# Patient Record
Sex: Female | Born: 1975 | Race: Black or African American | Hispanic: No | Marital: Single | Smoking: Never smoker
Health system: Southern US, Community
[De-identification: ages and names within clinical notes are randomized; demographics above are authoritative.]

## PROBLEM LIST (undated history)

## (undated) DIAGNOSIS — D219 Benign neoplasm of connective and other soft tissue, unspecified: Secondary | ICD-10-CM

## (undated) DIAGNOSIS — I10 Essential (primary) hypertension: Secondary | ICD-10-CM

## (undated) DIAGNOSIS — R600 Localized edema: Secondary | ICD-10-CM

## (undated) DIAGNOSIS — I160 Hypertensive urgency: Secondary | ICD-10-CM

## (undated) HISTORY — DX: Hypertensive urgency: I16.0

## (undated) HISTORY — DX: Benign neoplasm of connective and other soft tissue, unspecified: D21.9

## (undated) HISTORY — DX: Localized edema: R60.0

---

## 1998-11-20 ENCOUNTER — Emergency Department (HOSPITAL_COMMUNITY): Admission: EM | Admit: 1998-11-20 | Discharge: 1998-11-20 | Payer: Self-pay | Admitting: Emergency Medicine

## 1999-12-04 ENCOUNTER — Emergency Department (HOSPITAL_COMMUNITY): Admission: EM | Admit: 1999-12-04 | Discharge: 1999-12-04 | Payer: Self-pay

## 2001-02-02 ENCOUNTER — Inpatient Hospital Stay (HOSPITAL_COMMUNITY): Admission: AD | Admit: 2001-02-02 | Discharge: 2001-02-02 | Payer: Self-pay | Admitting: Obstetrics & Gynecology

## 2001-04-20 ENCOUNTER — Other Ambulatory Visit: Admission: RE | Admit: 2001-04-20 | Discharge: 2001-04-20 | Payer: Self-pay | Admitting: Obstetrics & Gynecology

## 2001-09-13 ENCOUNTER — Other Ambulatory Visit: Admission: RE | Admit: 2001-09-13 | Discharge: 2001-09-13 | Payer: Self-pay | Admitting: Obstetrics and Gynecology

## 2001-09-13 ENCOUNTER — Encounter: Payer: Self-pay | Admitting: Obstetrics and Gynecology

## 2001-09-13 ENCOUNTER — Inpatient Hospital Stay (HOSPITAL_COMMUNITY): Admission: AD | Admit: 2001-09-13 | Discharge: 2001-09-22 | Payer: Self-pay | Admitting: Obstetrics and Gynecology

## 2001-09-14 ENCOUNTER — Encounter: Payer: Self-pay | Admitting: Anesthesiology

## 2001-09-16 ENCOUNTER — Encounter: Payer: Self-pay | Admitting: Critical Care Medicine

## 2001-09-17 ENCOUNTER — Encounter: Payer: Self-pay | Admitting: Critical Care Medicine

## 2001-09-18 ENCOUNTER — Encounter: Payer: Self-pay | Admitting: Critical Care Medicine

## 2001-09-19 ENCOUNTER — Encounter: Payer: Self-pay | Admitting: Pulmonary Disease

## 2001-09-21 ENCOUNTER — Encounter: Payer: Self-pay | Admitting: Obstetrics and Gynecology

## 2001-11-26 ENCOUNTER — Inpatient Hospital Stay (HOSPITAL_COMMUNITY): Admission: AD | Admit: 2001-11-26 | Discharge: 2001-11-29 | Payer: Self-pay | Admitting: Obstetrics & Gynecology

## 2002-11-13 ENCOUNTER — Emergency Department (HOSPITAL_COMMUNITY): Admission: EM | Admit: 2002-11-13 | Discharge: 2002-11-13 | Payer: Self-pay | Admitting: Emergency Medicine

## 2002-11-17 ENCOUNTER — Emergency Department (HOSPITAL_COMMUNITY): Admission: EM | Admit: 2002-11-17 | Discharge: 2002-11-17 | Payer: Self-pay | Admitting: Emergency Medicine

## 2004-04-21 ENCOUNTER — Emergency Department (HOSPITAL_COMMUNITY): Admission: EM | Admit: 2004-04-21 | Discharge: 2004-04-21 | Payer: Self-pay | Admitting: Emergency Medicine

## 2004-04-26 ENCOUNTER — Other Ambulatory Visit: Admission: RE | Admit: 2004-04-26 | Discharge: 2004-04-26 | Payer: Self-pay | Admitting: Obstetrics & Gynecology

## 2004-12-08 ENCOUNTER — Inpatient Hospital Stay (HOSPITAL_COMMUNITY): Admission: RE | Admit: 2004-12-08 | Discharge: 2004-12-10 | Payer: Self-pay | Admitting: Obstetrics & Gynecology

## 2006-06-30 ENCOUNTER — Emergency Department (HOSPITAL_COMMUNITY): Admission: EM | Admit: 2006-06-30 | Discharge: 2006-06-30 | Payer: Self-pay | Admitting: Family Medicine

## 2006-12-23 ENCOUNTER — Emergency Department (HOSPITAL_COMMUNITY): Admission: EM | Admit: 2006-12-23 | Discharge: 2006-12-23 | Payer: Self-pay | Admitting: Family Medicine

## 2012-07-05 ENCOUNTER — Emergency Department (HOSPITAL_COMMUNITY)
Admission: EM | Admit: 2012-07-05 | Discharge: 2012-07-05 | Disposition: A | Discharge status: Home / Self Care | Payer: Self-pay | Attending: Emergency Medicine | Admitting: Emergency Medicine

## 2012-07-05 ENCOUNTER — Encounter (HOSPITAL_COMMUNITY): Payer: Self-pay

## 2012-07-05 DIAGNOSIS — K0381 Cracked tooth: Secondary | ICD-10-CM | POA: Insufficient documentation

## 2012-07-05 DIAGNOSIS — K0889 Other specified disorders of teeth and supporting structures: Secondary | ICD-10-CM

## 2012-07-05 MED ORDER — NAPROXEN 500 MG PO TABS: 500.0000 mg | ORAL_TABLET | Freq: Two times a day (BID) | ORAL | Status: AC | PRN

## 2012-07-05 MED ORDER — OXYCODONE-ACETAMINOPHEN 5-325 MG PO TABS: 1.0000 | ORAL_TABLET | ORAL | Status: AC | PRN

## 2012-07-05 MED ORDER — PENICILLIN V POTASSIUM 500 MG PO TABS: 500.0000 mg | ORAL_TABLET | Freq: Four times a day (QID) | ORAL | Status: AC

## 2012-07-05 NOTE — ED Notes (Signed)
Pt complains of dental pain, onset this week, sts rear right teeth swelling noted, no help with oragel, some releif with ice compress

## 2012-07-14 NOTE — ED Provider Notes (Signed)
History    51 six-year-old female with toothache. Right upper molar. Onset earlier this week. Denies acute trauma. Constant and worse when chewing and with drinking cold liquids. No difficulty breathing or swallowing. No facial swelling. No fevers or chills. Denies history of diabetes. Has not had dental evaluation for the same complaint.  CSN: [id number]  Arrival date & time 07/05/12  1027   First MD Initiated Contact with Patient 07/05/12 0818      Chief Complaint  Patient presents with  . Dental Pain    (Consider location/radiation/quality/duration/timing/severity/associated sxs/prior treatment) HPI  History reviewed. No pertinent past medical history.  No past surgical history on file.  No family history on file.  History  Substance Use Topics  . Smoking status: Not on file  . Smokeless tobacco: Not on file  . Alcohol Use: Not on file    OB History    Grav Para Term Preterm Abortions TAB SAB Ect Mult Living                  Review of Systems   Review of symptoms negative unless otherwise noted in HPI.   Allergies  Review of patient's allergies indicates no known allergies.  Home Medications   Current Outpatient Rx  Name Route Sig Dispense Refill  . IBUPROFEN 200 MG PO TABS Oral Take 400-800 mg by mouth every 6 (six) hours as needed. For pain    . NAPROXEN 500 MG PO TABS Oral Take 1 tablet (500 mg total) by mouth 2 (two) times daily as needed. 12 tablet 0  . OXYCODONE-ACETAMINOPHEN 5-325 MG PO TABS Oral Take 1 tablet by mouth every 4 (four) hours as needed for pain. 12 tablet 0    BP 164/102  Pulse 91  Temp 99 F (37.2 C) (Oral)  Resp 20  SpO2 100%  Physical Exam  Nursing note and vitals reviewed. Constitutional: She appears well-developed and well-nourished. No distress.  HENT:  Head: Normocephalic and atraumatic.       Cracked molar. No drainable collection. Handling secretions. Uvula midline. No tongue elevation. Submental tissues are soft. No  stridor. No cervical adenopathy. Neck is supple. Normal phonation.  Eyes: Conjunctivae are normal. Right eye exhibits no discharge. Left eye exhibits no discharge.  Neck: Neck supple.  Cardiovascular: Normal rate, regular rhythm and normal heart sounds.  Exam reveals no gallop and no friction rub.   No murmur heard. Pulmonary/Chest: Effort normal and breath sounds normal. No respiratory distress.  Abdominal: Soft. She exhibits no distension. There is no tenderness.  Musculoskeletal: She exhibits no edema and no tenderness.  Neurological: She is alert.  Skin: Skin is warm and dry.  Psychiatric: She has a normal mood and affect. Her behavior is normal. Thought content normal.    ED Course  Procedures (including critical care time)  Labs Reviewed - No data to display No results found.   1. Pain, dental       MDM  36yf with dental pain. No evidence of deep space neck infection or airway compromise. Plan course abx, prn pain meds and dental fu.        Raeford Razor, MD 07/14/12 [phone]

## 2016-02-17 ENCOUNTER — Encounter (HOSPITAL_COMMUNITY): Payer: Self-pay | Admitting: *Deleted

## 2016-02-17 ENCOUNTER — Emergency Department (HOSPITAL_COMMUNITY): Payer: Self-pay

## 2016-02-17 ENCOUNTER — Emergency Department (HOSPITAL_COMMUNITY)
Admission: EM | Admit: 2016-02-17 | Discharge: 2016-02-17 | Disposition: A | Discharge status: Home / Self Care | Payer: Self-pay | Attending: Emergency Medicine | Admitting: Emergency Medicine

## 2016-02-17 DIAGNOSIS — J069 Acute upper respiratory infection, unspecified: Secondary | ICD-10-CM

## 2016-02-17 MED ORDER — GUAIFENESIN ER 600 MG PO TB12: 1200.0000 mg | ORAL_TABLET | Freq: Two times a day (BID) | ORAL | Status: DC | PRN

## 2016-02-17 MED ORDER — IBUPROFEN 400 MG PO TABS
800.0000 mg | ORAL_TABLET | Freq: Once | ORAL | Status: AC
  Administered 2016-02-17: 800 mg via ORAL
  Filled 2016-02-17: qty 2

## 2016-02-17 MED ORDER — ACETAMINOPHEN 325 MG PO TABS
650.0000 mg | ORAL_TABLET | Freq: Once | ORAL | Status: AC
  Administered 2016-02-17: 650 mg via ORAL
  Filled 2016-02-17: qty 2

## 2016-02-17 MED ORDER — IBUPROFEN 800 MG PO TABS: 800.0000 mg | ORAL_TABLET | Freq: Three times a day (TID) | ORAL | Status: DC

## 2016-02-17 MED ORDER — BENZONATATE 100 MG PO CAPS: 100.0000 mg | ORAL_CAPSULE | Freq: Three times a day (TID) | ORAL | Status: DC

## 2016-02-17 NOTE — ED Notes (Signed)
Declined W/C at D/C and was escorted to lobby by RN. 

## 2016-02-17 NOTE — ED Notes (Signed)
PT reports URI since SAT.

## 2016-02-17 NOTE — Discharge Instructions (Signed)
Upper Respiratory Infection, Adult Most upper respiratory infections (URIs) are a viral infection of the air passages leading to the lungs. A URI affects the nose, throat, and upper air passages. The most common type of URI is nasopharyngitis and is typically referred to as "the common cold." URIs run their course and usually go away on their own. Most of the time, a URI does not require medical attention, but sometimes a bacterial infection in the upper airways can follow a viral infection. This is called a secondary infection. Sinus and middle ear infections are common types of secondary upper respiratory infections. Bacterial pneumonia can also complicate a URI. A URI can worsen asthma and chronic obstructive pulmonary disease (COPD). Sometimes, these complications can require emergency medical care and may be life threatening.  CAUSES Almost all URIs are caused by viruses. A virus is a type of germ and can spread from one person to another.  RISKS FACTORS You may be at risk for a URI if:   You smoke.   You have chronic heart or lung disease.  You have a weakened defense (immune) system.   You are very young or very old.   You have nasal allergies or asthma.  You work in crowded or poorly ventilated areas.  You work in health care facilities or schools. SIGNS AND SYMPTOMS  Symptoms typically develop 2-3 days after you come in contact with a cold virus. Most viral URIs last 7-10 days. However, viral URIs from the influenza virus (flu virus) can last 14-18 days and are typically more severe. Symptoms may include:   Runny or stuffy (congested) nose.   Sneezing.   Cough.   Sore throat.   Headache.   Fatigue.   Fever.   Loss of appetite.   Pain in your forehead, behind your eyes, and over your cheekbones (sinus pain).  Muscle aches.  DIAGNOSIS  Your health care provider may diagnose a URI by:  Physical exam.  Tests to check that your symptoms are not due to  another condition such as:  Strep throat.  Sinusitis.  Pneumonia.  Asthma. TREATMENT  A URI goes away on its own with time. It cannot be cured with medicines, but medicines may be prescribed or recommended to relieve symptoms. Medicines may help:  Reduce your fever.  Reduce your cough.  Relieve nasal congestion. HOME CARE INSTRUCTIONS   Take medicines only as directed by your health care provider.   Gargle warm saltwater or take cough drops to comfort your throat as directed by your health care provider.  Use a warm mist humidifier or inhale steam from a shower to increase air moisture. This may make it easier to breathe.  Drink enough fluid to keep your urine clear or pale yellow.   Eat soups and other clear broths and maintain good nutrition.   Rest as needed.   Return to work when your temperature has returned to normal or as your health care provider advises. You may need to stay home longer to avoid infecting others. You can also use a face mask and careful hand washing to prevent spread of the virus.  Increase the usage of your inhaler if you have asthma.   Do not use any tobacco products, including cigarettes, chewing tobacco, or electronic cigarettes. If you need help quitting, ask your health care provider. PREVENTION  The best way to protect yourself from getting a cold is to practice good hygiene.   Avoid oral or hand contact with people with cold   symptoms.   Wash your hands often if contact occurs.  There is no clear evidence that vitamin C, vitamin E, echinacea, or exercise reduces the chance of developing a cold. However, it is always recommended to get plenty of rest, exercise, and practice good nutrition.  SEEK MEDICAL CARE IF:   You are getting worse rather than better.   Your symptoms are not controlled by medicine.   You have chills.  You have worsening shortness of breath.  You have brown or red mucus.  You have yellow or brown nasal  discharge.  You have pain in your face, especially when you bend forward.  You have a fever.  You have swollen neck glands.  You have pain while swallowing.  You have white areas in the back of your throat. SEEK IMMEDIATE MEDICAL CARE IF:   You have severe or persistent:  Headache.  Ear pain.  Sinus pain.  Chest pain.  You have chronic lung disease and any of the following:  Wheezing.  Prolonged cough.  Coughing up blood.  A change in your usual mucus.  You have a stiff neck.  You have changes in your:  Vision.  Hearing.  Thinking.  Mood. MAKE SURE YOU:   Understand these instructions.  Will watch your condition.  Will get help right away if you are not doing well or get worse.   This information is not intended to replace advice given to you by your health care provider. Make sure you discuss any questions you have with your health care provider.   Document Released: 04/19/2001 Document Revised: 03/10/2015 Document Reviewed: 01/29/2014 Elsevier Interactive Patient Education 2016 [employer]  

## 2016-02-17 NOTE — ED Provider Notes (Signed)
CSN: [id number]     Arrival date & time 02/17/16  1530 History  By signing my name below, I, Mary Rios, attest that this documentation has been prepared under the direction and in the presence of [employer], PA-C. Electronically Signed: Randa Rios, ED Scribe. 02/17/2016. 4:28 PM.      Chief Complaint  Patient presents with  . URI   The history is provided by the patient. No language interpreter was used.   HPI Comments: Mary Rios is a 40 y.o. female who presents to the Emergency Department complaining of URI symptoms onset 4 days prior. Pt reports subjective fever, chills, cough, sore throat, congestion. Pt also reports resolved myalgias. Pt states that she has tried theraflu with no relief. Pt states she initially thought here symptoms were due to seasonal allergies and tried taking zyrtec with no relief. Pt denies ear pain, SOB, CP, nausea, vomiting, diarrhea or abdominal pain. Pt does report being around sick co workers. Her symptoms are constant, moderate, and gradually improving.   History reviewed. No pertinent past medical history. History reviewed. No pertinent past surgical history. History reviewed. No pertinent family history. Social History  Substance Use Topics  . Smoking status: Never Smoker   . Smokeless tobacco: Never Used  . Alcohol Use: No   OB History    No data available     Review of Systems  Constitutional: Positive for fever and chills.  HENT: Positive for congestion and sore throat. Negative for ear pain.   Respiratory: Positive for cough. Negative for shortness of breath.   Cardiovascular: Negative for chest pain.  Gastrointestinal: Negative for nausea, vomiting, abdominal pain and diarrhea.  All other systems reviewed and are negative.     Allergies  Review of patient's allergies indicates no known allergies.  Home Medications   Prior to Admission medications   Medication Sig Start Date End Date Taking? Authorizing Provider   ibuprofen (ADVIL,MOTRIN) 200 MG tablet Take 400-800 mg by mouth every 6 (six) hours as needed. For pain    Historical Provider, MD   BP 178/94 mmHg  Pulse 97  Temp(Src) 99.2 F (37.3 C) (Oral)  Resp 20  SpO2 100%  LMP 02/16/2016   Physical Exam  Constitutional: She is oriented to person, place, and time. She appears well-developed and well-nourished.  Non-toxic appearance. She does not have a sickly appearance. She does not appear ill.  HENT:  Head: Normocephalic and atraumatic.  Right Ear: Tympanic membrane and external ear normal. Tympanic membrane is not erythematous and not bulging.  Left Ear: Tympanic membrane and external ear normal. Tympanic membrane is not erythematous and not bulging.  Nose: Nose normal.  Mouth/Throat: Uvula is midline, oropharynx is clear and moist and mucous membranes are normal. No oropharyngeal exudate, posterior oropharyngeal edema or posterior oropharyngeal erythema.  Eyes: Conjunctivae are normal. Pupils are equal, round, and reactive to light. No scleral icterus.  Neck: Normal range of motion. Neck supple. No tracheal deviation present.  No nuchal rigidity.  Cardiovascular: Normal rate, regular rhythm and normal heart sounds.   No murmur heard. Pulmonary/Chest: Effort normal and breath sounds normal. No accessory muscle usage or stridor. No respiratory distress. She has no wheezes. She has no rhonchi. She has no rales.  Abdominal: Soft. Bowel sounds are normal. She exhibits no distension. There is no tenderness.  Musculoskeletal: Normal range of motion.  Lymphadenopathy:    She has no cervical adenopathy.  Neurological: She is alert and oriented to person, place, and time.  Speech clear without dysarthria.  Skin: Skin is warm and dry.  Psychiatric: She has a normal mood and affect. Her behavior is normal.  Nursing note and vitals reviewed.   ED Course  Procedures (including critical care time) DIAGNOSTIC STUDIES: Oxygen Saturation is 100% on  RA, normal by my interpretation.    COORDINATION OF CARE: 4:26 PM-Discussed treatment plan with pt at bedside and pt agreed to plan.     Labs Review Labs Reviewed - No data to display  Imaging Review Dg Chest 2 View  02/17/2016  CLINICAL DATA:  Chest tightness for 4 days EXAM: CHEST  2 VIEW COMPARISON:  None. FINDINGS: The heart size and mediastinal contours are within normal limits. Both lungs are clear. The visualized skeletal structures are unremarkable. IMPRESSION: No active cardiopulmonary disease. Electronically Signed   By: Inez Catalina M.D.   On: 02/17/2016 16:52      EKG Interpretation None      MDM   Final diagnoses:  URI (upper respiratory infection)   Patient with likely viral URI. VSS, NAD.  Well appearing, non-toxic. On exam, HENT exam unremarkable.  Heart RRR, lungs CTAB, abdomen soft and benign.  CXR to evaluate for PNA.  CENTOR criteria-1, no indication for further testing, low likelihood strep.  Patient given ibuprofen and tylenol in ED.  No coughing observed in ED.  Plan to discharge home with tessalon perle.  Recommend tylenol, ibuprofen, and mucinex for symptomatic control.  Follow up PCP. Discussed return precautions.  Patient agrees and acknowledges the above plan for discharge.    I personally performed the services described in this documentation, which was scribed in my presence. The recorded information has been reviewed and is accurate.      Gloriann Loan, PA-C 02/17/16 1740  Merrily Pew, MD 02/20/16 [phone]

## 2019-12-19 ENCOUNTER — Other Ambulatory Visit: Payer: Self-pay

## 2019-12-19 ENCOUNTER — Ambulatory Visit (HOSPITAL_COMMUNITY)
Admission: EM | Admit: 2019-12-19 | Discharge: 2019-12-19 | Disposition: A | Discharge status: Home / Self Care | Payer: BC Managed Care – PPO | Attending: Family Medicine | Admitting: Family Medicine

## 2019-12-19 ENCOUNTER — Encounter (HOSPITAL_COMMUNITY): Payer: Self-pay | Admitting: Emergency Medicine

## 2019-12-19 DIAGNOSIS — N898 Other specified noninflammatory disorders of vagina: Secondary | ICD-10-CM | POA: Diagnosis present

## 2019-12-19 DIAGNOSIS — Z3202 Encounter for pregnancy test, result negative: Secondary | ICD-10-CM

## 2019-12-19 DIAGNOSIS — R109 Unspecified abdominal pain: Secondary | ICD-10-CM | POA: Insufficient documentation

## 2019-12-19 LAB — POCT URINALYSIS DIP (DEVICE)
Bilirubin Urine: NEGATIVE
Glucose, UA: NEGATIVE mg/dL
Ketones, ur: NEGATIVE mg/dL
Nitrite: NEGATIVE
Protein, ur: 30 mg/dL — AB
Specific Gravity, Urine: 1.025 (ref 1.005–1.030)
Urobilinogen, UA: 0.2 mg/dL (ref 0.0–1.0)
pH: 5.5 (ref 5.0–8.0)

## 2019-12-19 LAB — POC URINE PREG, ED: Preg Test, Ur: NEGATIVE

## 2019-12-19 LAB — POCT PREGNANCY, URINE: Preg Test, Ur: NEGATIVE

## 2019-12-19 MED ORDER — LIDOCAINE HCL (PF) 1 % IJ SOLN
INTRAMUSCULAR | Status: AC
  Filled 2019-12-19: qty 2

## 2019-12-19 MED ORDER — METRONIDAZOLE 500 MG PO TABS: 500.0000 mg | ORAL_TABLET | Freq: Two times a day (BID) | ORAL | 0 refills | Status: DC

## 2019-12-19 MED ORDER — CEFTRIAXONE SODIUM 500 MG IJ SOLR
INTRAMUSCULAR | Status: AC
  Filled 2019-12-19: qty 500

## 2019-12-19 MED ORDER — CEFTRIAXONE SODIUM 500 MG IJ SOLR
500.0000 mg | Freq: Once | INTRAMUSCULAR | Status: AC
  Administered 2019-12-19: 500 mg via INTRAMUSCULAR

## 2019-12-19 NOTE — Discharge Instructions (Addendum)
Treating you for STDs today. Antibiotic injection given here and medicine sent to the pharmacy.  Swab sent for testing and we will call with any positive results.

## 2019-12-19 NOTE — ED Triage Notes (Addendum)
Vaginal discharge noticed monday.  Discharge is greenish and has an odor.  Patient reports about to start period in one week and having crampy feeling she associates with starting period  Patient denies burning with urination, but is urinating more frequently

## 2019-12-20 LAB — URINE CULTURE: Culture: 1000 — AB

## 2019-12-20 NOTE — ED Provider Notes (Signed)
[place]    CSN: [id number] Arrival date & time: 12/19/19  1002      History   Chief Complaint Chief Complaint  Patient presents with  . Vaginal Discharge    HPI Mary Rios is a 44 y.o. female.   Patient is a 45 year old female presents today with vaginal discharge.  Describes the discharge as green and malodorous.  This is been constant.  She is currently sexual active with 1 partner, unprotected.  Some generalized intermittent abdominal cramping.  Denies any vaginal itching, irritation, dysuria, hematuria but is having some urinary frequency.  No pelvic pain, flank pain, nausea or vomiting.  No recorded fevers at home.  ROS per HPI      History reviewed. No pertinent past medical history.  There are no problems to display for this patient.   Past Surgical History:  Procedure Laterality Date  . CESAREAN SECTION      OB History   No obstetric history on file.      Home Medications    Prior to Admission medications   Medication Sig Start Date End Date Taking? Authorizing Provider  ibuprofen (ADVIL,MOTRIN) 800 MG tablet Take 1 tablet (800 mg total) by mouth 3 (three) times daily. 02/17/16   Gloriann Loan, PA-C  metroNIDAZOLE (FLAGYL) 500 MG tablet Take 1 tablet (500 mg total) by mouth 2 (two) times daily. 12/19/19   Orvan July, NP    Family History Family History  Problem Relation Age of Onset  . Hypertension Mother   . Diabetes Mother     Social History Social History   Tobacco Use  . Smoking status: Never Smoker  . Smokeless tobacco: Never Used  Substance Use Topics  . Alcohol use: Yes  . Drug use: No     Allergies   Patient has no known allergies.   Review of Systems Review of Systems   Physical Exam Triage Vital Signs ED Triage Vitals  Enc Vitals Group     BP 12/19/19 1020 (!) 160/88     Pulse Rate 12/19/19 1020 (!) 103     Resp 12/19/19 1020 18     Temp 12/19/19 1020 99 F (37.2 C)     Temp Source 12/19/19  1020 Oral     SpO2 12/19/19 1020 100 %     Weight --      Height --      Head Circumference --      Peak Flow --      Pain Score 12/19/19 1017 1     Pain Loc --      Pain Edu? --      Excl. in [place]? --    No data found.  Updated Vital Signs BP (!) 160/88 (BP Location: Left Arm)   Pulse (!) 103   Temp 99 F (37.2 C) (Oral)   Resp 18   LMP 11/30/2019   SpO2 100%   Visual Acuity Right Eye Distance:   Left Eye Distance:   Bilateral Distance:    Right Eye Near:   Left Eye Near:    Bilateral Near:     Physical Exam Vitals and nursing note reviewed.  Constitutional:      General: She is not in acute distress.    Appearance: Normal appearance. She is not ill-appearing, toxic-appearing or diaphoretic.  HENT:     Head: Normocephalic.     Nose: Nose normal.  Eyes:     Conjunctiva/sclera: Conjunctivae normal.  Pulmonary:  Effort: Pulmonary effort is normal.  Abdominal:     Palpations: Abdomen is soft.     Tenderness: There is no abdominal tenderness.  Genitourinary:    Comments: Deferred  Musculoskeletal:        General: Normal range of motion.     Cervical back: Normal range of motion.  Skin:    General: Skin is warm and dry.     Findings: No rash.  Neurological:     Mental Status: She is alert.  Psychiatric:        Mood and Affect: Mood normal.      UC Treatments / Results  Labs (all labs ordered are listed, but only abnormal results are displayed) Labs Reviewed  URINE CULTURE - Abnormal; Notable for the following components:      Result Value   Culture   (*)    Value: 1,000 COLONIES/mL GROUP B STREP(S.AGALACTIAE)ISOLATED TESTING AGAINST S. AGALACTIAE NOT ROUTINELY PERFORMED DUE TO PREDICTABILITY OF AMP/PEN/VAN SUSCEPTIBILITY. Performed at [facility], [place] [address], [place], [place] [address]    All other components within normal limits  POCT URINALYSIS DIP (DEVICE) - Abnormal; Notable for the following components:   Hgb urine dipstick TRACE  (*)    Protein, ur 30 (*)    Leukocytes,Ua SMALL (*)    All other components within normal limits  POCT PREGNANCY, URINE  POC URINE PREG, ED  CERVICOVAGINAL ANCILLARY ONLY    EKG   Radiology No results found.  Procedures Procedures (including critical care time)  Medications Ordered in UC Medications  cefTRIAXone (ROCEPHIN) injection 500 mg (500 mg Intramuscular Given 12/19/19 1057)    Initial Impression / Assessment and Plan / UC Course  I have reviewed the triage vital signs and the nursing notes.  Pertinent labs & imaging results that were available during my care of the patient were reviewed by me and considered in my medical decision making (see chart for details).     Vaginal discharge-patient reported discharge to be green and malodorous.  Most likely diagnosis would be trichomonas.  We will go ahead and cover for that today with Flagyl twice a day for 7 days.  This will  also treat for possible BV. Giving Rocephin injection here for possible gonorrhea Swab sent for testing labs pending. Final Clinical Impressions(s) / UC Diagnoses   Final diagnoses:  Vaginal discharge     Discharge Instructions     Treating you for STDs today. Antibiotic injection given here and medicine sent to the pharmacy.  Swab sent for testing and we will call with any positive results.     ED Prescriptions    Medication Sig Dispense Auth. Provider   metroNIDAZOLE (FLAGYL) 500 MG tablet Take 1 tablet (500 mg total) by mouth 2 (two) times daily. 14 tablet Kavonte Bearse A, NP     PDMP not reviewed this encounter.   Orvan July, NP 12/20/19 1432

## 2019-12-21 LAB — CERVICOVAGINAL ANCILLARY ONLY
Bacterial vaginitis: POSITIVE — AB
Candida vaginitis: NEGATIVE
Chlamydia: NEGATIVE
Neisseria Gonorrhea: NEGATIVE
Trichomonas: POSITIVE — AB

## 2019-12-23 ENCOUNTER — Telehealth (HOSPITAL_COMMUNITY): Payer: Self-pay | Admitting: Emergency Medicine

## 2019-12-23 NOTE — Telephone Encounter (Signed)
Bacterial Vaginosis test is positive.  Prescription for metronidazole was given at the urgent care visit.  Trichomonas is positive. Rx metronidazole was given at the urgent care visit. Please refrain from sexual intercourse for 7 days to give the medicine time to work. Sexual partners need to be notified and tested/treated. Condoms may reduce risk of reinfection. Recheck for further evaluation if symptoms are not improving.   Patient contacted by phone and made aware of    results. Pt verbalized understanding and had all questions answered.

## 2020-01-09 ENCOUNTER — Ambulatory Visit (HOSPITAL_COMMUNITY)
Admission: EM | Admit: 2020-01-09 | Discharge: 2020-01-09 | Disposition: A | Discharge status: Home / Self Care | Payer: BC Managed Care – PPO | Attending: Family Medicine | Admitting: Family Medicine

## 2020-01-09 ENCOUNTER — Other Ambulatory Visit: Payer: Self-pay

## 2020-01-09 ENCOUNTER — Ambulatory Visit (INDEPENDENT_AMBULATORY_CARE_PROVIDER_SITE_OTHER): Payer: BC Managed Care – PPO

## 2020-01-09 ENCOUNTER — Encounter (HOSPITAL_COMMUNITY): Payer: Self-pay

## 2020-01-09 DIAGNOSIS — R1903 Right lower quadrant abdominal swelling, mass and lump: Secondary | ICD-10-CM

## 2020-01-09 DIAGNOSIS — N898 Other specified noninflammatory disorders of vagina: Secondary | ICD-10-CM

## 2020-01-09 LAB — POCT URINALYSIS DIP (DEVICE)
Bilirubin Urine: NEGATIVE
Glucose, UA: NEGATIVE mg/dL
Ketones, ur: NEGATIVE mg/dL
Nitrite: NEGATIVE
Protein, ur: NEGATIVE mg/dL
Specific Gravity, Urine: 1.015 (ref 1.005–1.030)
Urobilinogen, UA: 0.2 mg/dL (ref 0.0–1.0)
pH: 7 (ref 5.0–8.0)

## 2020-01-09 NOTE — ED Triage Notes (Signed)
Pt states she has a vaginal discharge and foul odor 6 days. Pt state she has some blood on the tissue after a bowel movement pt states the stools are soft x 4 days

## 2020-01-09 NOTE — ED Provider Notes (Addendum)
[place]   [id number] 01/09/20 Arrival Time: [id number]  ASSESSMENT & PLAN:  1. Right lower quadrant abdominal mass   2. Foul smelling vaginal discharge     Discussed concern over palpable abdominal mass along with vaginal discharge that smells of feces. Offered exam here out of concern over the possibility of rectovaginal fistula. She does not currently have a primary care provider and prefers ED evaluation at this time for a more definitive diagnosis as I am unable to perform advanced imaging here. Defers vaginal/rectal exam here.  UPT negative. Stable upon discharge. Reports she will proceed to the ED now.  I have personally viewed the imaging studies ordered this visit. Apparent mass RLQ; much stool. No signs of obstruction.  Reviewed expectations re: course of current medical issues. Questions answered. Outlined signs and symptoms indicating need for more acute intervention. Patient verbalized understanding. After Visit Summary given.   SUBJECTIVE: History from: patient. Mary Rios is a 44 y.o. female who reports mild lower abdominal discomfort along with sporadic "dark" malodorous vaginal discharge with fecal odor. Discharge noted approx 5-6 d ago. Occasional bright red blood noted after bowel movements. Questions abdominal discomfort started over the past couple of days. Bowel movements are soft. No straining with bowel movements. Normal PO intake without n/v. No urinary symptoms. Is sexually active with one female partner. Treated for BV and trich last month. Patient's last menstrual period was 01/01/2020. Reports menstrual periods are regular. Ambulatory without difficulty. No recent weight changes reported. No recent travel. No fevers reported. Symptoms do not wake her at night. No LE edema. No new medications.  Reports that she does not have a primary care provider.    Past Surgical History:  Procedure Laterality Date  . CESAREAN SECTION        OBJECTIVE:  Vitals:   01/09/20 1647 01/09/20 1649  BP:  140/88  Pulse:  100  Resp:  16  Temp:  100.1 F (37.8 C)  TempSrc:  Oral  SpO2:  100%  Weight: 61.2 kg    Low grade temp noted.   General appearance: alert, oriented, no acute distress HEENT: [place]; AT Lungs: unlabored respirations Abdomen: overall soft; lower abdomen appears protruded (R>L) with a firm palpable mass; does report TTP with exam; no guarding or rebound tenderness Back: without CVA tenderness; FROM at waist GU/Rectal: declined Extremities: without LE edema; symmetrical; without gross deformities Skin: warm and dry Neurologic: normal gait Psychological: alert and cooperative; normal mood and affect  Labs: Results for orders placed or performed during the hospital encounter of 01/09/20  POCT urinalysis dip (device)  Result Value Ref Range   Glucose, UA NEGATIVE NEGATIVE mg/dL   Bilirubin Urine NEGATIVE NEGATIVE   Ketones, ur NEGATIVE NEGATIVE mg/dL   Specific Gravity, Urine 1.015 1.005 - 1.030   Hgb urine dipstick TRACE (A) NEGATIVE   pH 7.0 5.0 - 8.0   Protein, ur NEGATIVE NEGATIVE mg/dL   Urobilinogen, UA 0.2 0.0 - 1.0 mg/dL   Nitrite NEGATIVE NEGATIVE   Leukocytes,Ua TRACE (A) NEGATIVE   Labs Reviewed  POC URINE PREG, ED  CERVICOVAGINAL ANCILLARY ONLY    No Known Allergies                                             History reviewed. No pertinent past medical history.  Social History   Socioeconomic History  .  Marital status: Single    Spouse name: Not on file  . Number of children: Not on file  . Years of education: Not on file  . Highest education level: Not on file  Occupational History  . Not on file  Tobacco Use  . Smoking status: Never Smoker  . Smokeless tobacco: Never Used  Substance and Sexual Activity  . Alcohol use: Yes  . Drug use: No  . Sexual activity: Not on file  Other Topics Concern  . Not on file  Social History Narrative  . Not on file   Social  Determinants of Health   Financial Resource Strain:   . Difficulty of Paying Living Expenses: Not on file  Food Insecurity:   . Worried About Charity fundraiser in the Last Year: Not on file  . Ran Out of Food in the Last Year: Not on file  Transportation Needs:   . Lack of Transportation (Medical): Not on file  . Lack of Transportation (Non-Medical): Not on file  Physical Activity:   . Days of Exercise per Week: Not on file  . Minutes of Exercise per Session: Not on file  Stress:   . Feeling of Stress : Not on file  Social Connections:   . Frequency of Communication with Friends and Family: Not on file  . Frequency of Social Gatherings with Friends and Family: Not on file  . Attends Religious Services: Not on file  . Active Member of Clubs or Organizations: Not on file  . Attends Archivist Meetings: Not on file  . Marital Status: Not on file  Intimate Partner Violence:   . Fear of Current or Ex-Partner: Not on file  . Emotionally Abused: Not on file  . Physically Abused: Not on file  . Sexually Abused: Not on file    Family History  Problem Relation Age of Onset  . Hypertension Mother   . Diabetes Mother      Vanessa Kick, MD 01/09/20 Gwendlyn Deutscher, MD 01/09/20 [phone]

## 2020-01-10 ENCOUNTER — Other Ambulatory Visit: Payer: Self-pay

## 2020-01-10 ENCOUNTER — Emergency Department (HOSPITAL_COMMUNITY): Payer: BC Managed Care – PPO

## 2020-01-10 ENCOUNTER — Encounter (HOSPITAL_COMMUNITY): Payer: Self-pay | Admitting: Emergency Medicine

## 2020-01-10 ENCOUNTER — Emergency Department (HOSPITAL_COMMUNITY)
Admission: EM | Admit: 2020-01-10 | Discharge: 2020-01-10 | Disposition: A | Discharge status: Home / Self Care | Payer: BC Managed Care – PPO | Attending: Emergency Medicine | Admitting: Emergency Medicine

## 2020-01-10 DIAGNOSIS — N899 Noninflammatory disorder of vagina, unspecified: Secondary | ICD-10-CM | POA: Diagnosis present

## 2020-01-10 DIAGNOSIS — T192XXA Foreign body in vulva and vagina, initial encounter: Secondary | ICD-10-CM | POA: Insufficient documentation

## 2020-01-10 DIAGNOSIS — Y999 Unspecified external cause status: Secondary | ICD-10-CM | POA: Diagnosis not present

## 2020-01-10 DIAGNOSIS — Y939 Activity, unspecified: Secondary | ICD-10-CM | POA: Insufficient documentation

## 2020-01-10 DIAGNOSIS — D259 Leiomyoma of uterus, unspecified: Secondary | ICD-10-CM | POA: Diagnosis not present

## 2020-01-10 DIAGNOSIS — R1903 Right lower quadrant abdominal swelling, mass and lump: Secondary | ICD-10-CM | POA: Diagnosis not present

## 2020-01-10 DIAGNOSIS — X58XXXA Exposure to other specified factors, initial encounter: Secondary | ICD-10-CM | POA: Insufficient documentation

## 2020-01-10 DIAGNOSIS — Y929 Unspecified place or not applicable: Secondary | ICD-10-CM | POA: Diagnosis not present

## 2020-01-10 DIAGNOSIS — N898 Other specified noninflammatory disorders of vagina: Secondary | ICD-10-CM

## 2020-01-10 DIAGNOSIS — K921 Melena: Secondary | ICD-10-CM | POA: Insufficient documentation

## 2020-01-10 LAB — LIPASE, BLOOD: Lipase: 23 U/L (ref 11–51)

## 2020-01-10 LAB — CBC WITH DIFFERENTIAL/PLATELET
Abs Immature Granulocytes: 0 10*3/uL (ref 0.00–0.07)
Basophils Absolute: 0 10*3/uL (ref 0.0–0.1)
Basophils Relative: 0 %
Eosinophils Absolute: 0.2 10*3/uL (ref 0.0–0.5)
Eosinophils Relative: 2 %
HCT: 26.4 % — ABNORMAL LOW (ref 36.0–46.0)
Hemoglobin: 7.6 g/dL — ABNORMAL LOW (ref 12.0–15.0)
Lymphocytes Relative: 27 %
Lymphs Abs: 2.1 10*3/uL (ref 0.7–4.0)
MCH: 16.3 pg — ABNORMAL LOW (ref 26.0–34.0)
MCHC: 28.8 g/dL — ABNORMAL LOW (ref 30.0–36.0)
MCV: 56.7 fL — ABNORMAL LOW (ref 80.0–100.0)
Monocytes Absolute: 0.8 10*3/uL (ref 0.1–1.0)
Monocytes Relative: 10 %
Neutro Abs: 4.8 10*3/uL (ref 1.7–7.7)
Neutrophils Relative %: 61 %
Platelets: ADEQUATE 10*3/uL (ref 150–400)
RBC: 4.66 MIL/uL (ref 3.87–5.11)
RDW: 20.5 % — ABNORMAL HIGH (ref 11.5–15.5)
WBC: 7.9 10*3/uL (ref 4.0–10.5)
nRBC: 0 % (ref 0.0–0.2)

## 2020-01-10 LAB — URINALYSIS, ROUTINE W REFLEX MICROSCOPIC
Bacteria, UA: NONE SEEN
Bilirubin Urine: NEGATIVE
Glucose, UA: NEGATIVE mg/dL
Hgb urine dipstick: NEGATIVE
Ketones, ur: NEGATIVE mg/dL
Nitrite: NEGATIVE
Protein, ur: NEGATIVE mg/dL
Specific Gravity, Urine: 1.016 (ref 1.005–1.030)
pH: 6 (ref 5.0–8.0)

## 2020-01-10 LAB — WET PREP, GENITAL
Clue Cells Wet Prep HPF POC: NONE SEEN
Sperm: NONE SEEN
Trich, Wet Prep: NONE SEEN
Yeast Wet Prep HPF POC: NONE SEEN

## 2020-01-10 LAB — COMPREHENSIVE METABOLIC PANEL
ALT: 13 U/L (ref 0–44)
AST: 20 U/L (ref 15–41)
Albumin: 3.5 g/dL (ref 3.5–5.0)
Alkaline Phosphatase: 51 U/L (ref 38–126)
Anion gap: 8 (ref 5–15)
BUN: 10 mg/dL (ref 6–20)
CO2: 21 mmol/L — ABNORMAL LOW (ref 22–32)
Calcium: 9.2 mg/dL (ref 8.9–10.3)
Chloride: 108 mmol/L (ref 98–111)
Creatinine, Ser: 0.84 mg/dL (ref 0.44–1.00)
GFR calc Af Amer: >60 mL/min (ref >60)
GFR calc non Af Amer: >60 mL/min (ref >60)
Glucose, Bld: 105 mg/dL — ABNORMAL HIGH (ref 70–99)
Potassium: 3.7 mmol/L (ref 3.5–5.1)
Sodium: 137 mmol/L (ref 135–145)
Total Bilirubin: 0.3 mg/dL (ref 0.3–1.2)
Total Protein: 7.2 g/dL (ref 6.5–8.1)

## 2020-01-10 LAB — POC URINE PREG, ED: Preg Test, Ur: NEGATIVE

## 2020-01-10 LAB — POC OCCULT BLOOD, ED: Fecal Occult Bld: NEGATIVE

## 2020-01-10 MED ORDER — IOHEXOL 300 MG/ML  SOLN
100.0000 mL | Freq: Once | INTRAMUSCULAR | Status: AC | PRN
  Administered 2020-01-10: 100 mL via INTRAVENOUS

## 2020-01-10 NOTE — Discharge Instructions (Addendum)
Your red blood cell count was very low today.  Please follow-up with your primary care doctor for evaluation of your anemia.  Follow-up with your OB/GYN.  Keep your appointment they have scheduled and see if he can move it up.  I have included some information for iron rich diet and for fibroids which you have in your uterus.  These fibroids are likely the reason for your more significant vaginal bleeding during her last period.

## 2020-01-10 NOTE — ED Notes (Signed)
Some l rt lower abd pain   [facility] was [facility] but had to leave before she was sent here as they planned  No pelvic exam was done there

## 2020-01-10 NOTE — ED Notes (Signed)
The pts last period was yesterday  And as soon as her period ended she began to have a foul odor and a discharge  She did not have either before her period started

## 2020-01-10 NOTE — ED Provider Notes (Signed)
[facility] EMERGENCY DEPARTMENT Provider Note   CSN: [id number] Arrival date & time: 01/10/20  1615     History Chief Complaint  Patient presents with  . Vaginal Discharge    Mary Rios is a 44 y.o. female.  HPI  Patient of no significant past medical history and surgical history of 3 cesarean sections and tubal ligation presenting today with complaints of vaginal discharge that is malodorous, brown.  Patient states that the discharge started on Monday. Patient states that her last menstrual period was several days before that.  Patient denies any bloody vaginal discharge.  She does state that she has occasional small amounts of bright red blood per rectum when she wipes.  She states her bowel movements have been soft denies any known history of hemorrhoids.  Patient denies any abdominal pain, nausea, vomiting, diarrhea.  Denies any fevers or chills.  Denies any body aches.  Patient states that she was seen in urgent care prior to arriving in the emergency department and was found to have a hard abdominal mass in her right lower abdomen.  She was sent to ED for further evaluation of this as well as for her vaginal discharge with concern for rectovaginal fistula as well as concerning abdominal mass.     History reviewed. No pertinent past medical history.  There are no problems to display for this patient.   Past Surgical History:  Procedure Laterality Date  . CESAREAN SECTION       OB History   No obstetric history on file.     Family History  Problem Relation Age of Onset  . Hypertension Mother   . Diabetes Mother     Social History   Tobacco Use  . Smoking status: Never Smoker  . Smokeless tobacco: Never Used  Substance Use Topics  . Alcohol use: Yes  . Drug use: No    Home Medications Prior to Admission medications   Medication Sig Start Date End Date Taking? Authorizing Provider  ibuprofen (ADVIL,MOTRIN) 800 MG tablet Take 1 tablet  (800 mg total) by mouth 3 (three) times daily. Patient not taking: Reported on 01/10/2020 02/17/16   Gloriann Loan, PA-C  metroNIDAZOLE (FLAGYL) 500 MG tablet Take 1 tablet (500 mg total) by mouth 2 (two) times daily. Patient not taking: Reported on 01/09/2020 12/19/19   Orvan July, NP    Allergies    Patient has no known allergies.  Review of Systems   Review of Systems  Constitutional: Negative for chills and fever.  HENT: Negative for congestion.   Eyes: Negative for pain.  Respiratory: Negative for cough and shortness of breath.   Cardiovascular: Negative for chest pain and leg swelling.  Gastrointestinal: Positive for blood in stool. Negative for abdominal pain and vomiting.  Genitourinary: Positive for vaginal discharge. Negative for dysuria, flank pain, frequency and hematuria.  Musculoskeletal: Negative for myalgias.  Skin: Negative for rash.  Neurological: Negative for dizziness and headaches.    Physical Exam Updated Vital Signs BP (!) 175/88 (BP Location: Right Arm)   Pulse 80   Temp 99.5 F (37.5 C) (Oral)   Resp 18   Ht 4\' 11"  (1.499 m)   Wt 61.2 kg   LMP 12/31/2019 (Exact Date)   SpO2 98%   BMI 27.25 kg/m   Physical Exam Vitals and nursing note reviewed.  Constitutional:      General: She is not in acute distress. HENT:     Head: Normocephalic and atraumatic.  Nose: Nose normal.     Mouth/Throat:     Mouth: Mucous membranes are moist.  Eyes:     General: No scleral icterus. Cardiovascular:     Rate and Rhythm: Normal rate and regular rhythm.     Pulses: Normal pulses.     Heart sounds: Normal heart sounds.  Pulmonary:     Effort: Pulmonary effort is normal. No respiratory distress.     Breath sounds: No wheezing.  Abdominal:     Palpations: Abdomen is soft. There is mass.     Tenderness: There is no abdominal tenderness.     Comments: Abdomen with hard palpable mass in the right lower quadrant that extends from the right anterior iliac crest to  just across the midline of the abdomen.  No tenderness with palpation.  Some mild distention of the abdomen.  No hernia.  No guarding or rebound.  Genitourinary:    Comments: Copious, malodorous brown vaginal discharge obstructing view  After wall suctioning to remove vaginal discharge there is brown foreign body likely piece of tampon in the vaginal fornix.  This is removed with forceps.  No cervical motion tenderness or adnexal tenderness after removal abject.  Unable to visualize cervix.  Musculoskeletal:     Cervical back: Normal range of motion.     Right lower leg: No edema.     Left lower leg: No edema.  Skin:    General: Skin is warm and dry.     Capillary Refill: Capillary refill takes less than 2 seconds.  Neurological:     Mental Status: She is alert. Mental status is at baseline.  Psychiatric:        Mood and Affect: Mood normal.        Behavior: Behavior normal.     ED Results / Procedures / Treatments   Labs (all labs ordered are listed, but only abnormal results are displayed) Labs Reviewed  WET PREP, GENITAL - Abnormal; Notable for the following components:      Result Value   WBC, Wet Prep HPF POC MANY (*)    All other components within normal limits  URINALYSIS, ROUTINE W REFLEX MICROSCOPIC - Abnormal; Notable for the following components:   Leukocytes,Ua TRACE (*)    All other components within normal limits  CBC WITH DIFFERENTIAL/PLATELET - Abnormal; Notable for the following components:   Hemoglobin 7.6 (*)    HCT 26.4 (*)    MCV 56.7 (*)    MCH 16.3 (*)    MCHC 28.8 (*)    RDW 20.5 (*)    All other components within normal limits  COMPREHENSIVE METABOLIC PANEL - Abnormal; Notable for the following components:   CO2 21 (*)    Glucose, Bld 105 (*)    All other components within normal limits  LIPASE, BLOOD  POC URINE PREG, ED  POC OCCULT BLOOD, ED  GC/CHLAMYDIA PROBE AMP ([place]) NOT AT [facility]    EKG None  Radiology CT ABDOMEN PELVIS W  CONTRAST  Result Date: 01/10/2020 CLINICAL DATA:  Palpable right lower quadrant mass. Malodorous vaginal discharge. EXAM: CT ABDOMEN AND PELVIS WITH CONTRAST TECHNIQUE: Multidetector CT imaging of the abdomen and pelvis was performed using the standard protocol following bolus administration of intravenous contrast. CONTRAST:  139mL OMNIPAQUE IOHEXOL 300 MG/ML  SOLN COMPARISON:  Abdominal radiograph 01/09/2019 FINDINGS: Lower chest: Lung bases are clear. Normal heart size. No pericardial effusion. Hepatobiliary: No focal liver abnormality is seen. Gallbladder is largely decompressed. No visible gallstones, gallbladder wall  thickening, or biliary dilatation. Pancreas: Unremarkable. No pancreatic ductal dilatation or surrounding inflammatory changes. Spleen: Normal in size without focal abnormality. Adrenals/Urinary Tract: Normal adrenal glands. Fluid attenuation cysts seen in the lower pole right kidney. No concerning renal masses, no urolithiasis or hydronephrosis. No gross bladder wall thickening, debris or calculi. Stomach/Bowel: Distal esophagus, stomach and duodenum are unremarkable. Much of the small bowel is displaced by the massively enlarged uterus. No resulting obstruction. No small bowel dilatation or wall thickening. A normal appendix is visualized. Proximal colon is unremarkable. There is notable rectal wall thickening and loss of discernible fat plane between the anterolateral wall of the rectum and the vagina. Vascular/Lymphatic: Normal caliber aorta. Normally opacifying arterial structures. Hepatic and portal veins are normally opacified. There is extensive engorgement of the bilateral parametrial vessels, right greater than left. Reproductive: Massive enlargement of the uterus with numerous heterogeneously enhancing fibroids largest measuring up to 11.3 x 10.5 cm at the uterine fundus. Several more pedunculated subserosal fibroids are noted as well with peripheral enhancement which could reflect  different stages of degeneration. Most focally enhancing seen along the right lateral aspect of the uterus (coronal 7/35). Difficult to discern the normal ovaries though suspect the right ovary is a structure displaced just medial to the right iliac crest and the left ovary remains a low pelvic position without separate discernible adnexal lesions. There is a moderate volume of low-attenuation almost feculent appearing material within the vaginal vault. Hyperemic changes of the vagina are noted as well as loss of discernible fat plane between the vagina and adjacent rectum. Other: Phlegmonous changes and fluid are noted in the deep pelvis. No free air. No bowel containing hernias. Musculoskeletal: No acute osseous abnormality or suspicious osseous lesion. IMPRESSION: Aerated material within the vaginal canal, could reflect feculent debris versus retained tampon or other vaginal foreign body. Loss of discernible fat plane with the adjacent thickened rectum with surrounding hyperemic and inflammatory features in the low pelvis could favor feculent material in the setting of a possible rectovaginal fistula. Recommend correlation with direct pelvic examination and evacuation of the vaginal canal. Following decompression of both distal colon/rectum and vagina, luminal contrast techniques could potentially be utilized to assess for fistulization if there is continued clinical ambiguity. Massive enlargement of the uterus with numerous fibroids both intramural, submucosal and pedunculated subserosal. Several of which are hyperenhancing suggesting active degeneration at this time. If further evaluation is clinically warranted would consider MRI based on the size of these lesions as sonography may be unable to fully evaluate the extent. Extensive parametrial venous engorgement, such findings could be seen in the setting of pelvic congestion syndrome. Recommend correlation with patient's symptomatology. These results were  called by telephone at the time of interpretation on 01/10/2020 at 9:07 pm to provider [facility] , who verbally acknowledged these results. Electronically Signed   By: Lovena Le M.D.   On: 01/10/2020 21:07   DG Abd 2 Views  Result Date: 01/09/2020 CLINICAL DATA:  Palpable right lower abdominal mass. EXAM: ABDOMEN - 2 VIEW COMPARISON:  No prior radiographic or cross-sectional imaging. FINDINGS: A large amount of stool distends the rectum. Stool is present throughout the colon including in the right lower quadrant cecum and distal sigmoid colon. A smoothly contoured 6 x 4 cm ovoid soft tissue density projects on the right lower paraspinal region, incompletely characterized on radiography. There is borderline cardiomegaly. IMPRESSION: A smoothly contoured ovoid 6 x 4 cm soft tissue density projecting on the right lower quadrant, incompletely  characterized on radiography. Further evaluation with CT or MR abdomen pelvis recommended, preferably with intravenous contrast. Large stool burden without bowel obstruction. Electronically Signed   By: Revonda Humphrey   On: 01/09/2020 17:35    Procedures .Foreign Body Removal  Date/Time: 01/11/2020 8:55 AM Performed by: Tedd Sias, PA Authorized by: Tedd Sias, PA  Consent: Verbal consent obtained. Consent given by: patient Patient understanding: patient states understanding of the procedure being performed Patient consent: the patient's understanding of the procedure matches consent given Procedure consent: procedure consent matches procedure scheduled Relevant documents: relevant documents present and verified Test results: test results available and properly labeled Imaging studies: imaging studies available Patient identity confirmed: verbally with patient and arm band Body area: vagina  Sedation: Patient sedated: no  Patient restrained: no Localization method: visualized Removal mechanism: ring forceps Complexity: simple 1 objects  recovered. Post-procedure assessment: foreign body removed Patient tolerance: patient tolerated the procedure well with no immediate complications Comments: Large retained piece of tampon.   (including critical care time)  Medications Ordered in ED Medications  iohexol (OMNIPAQUE) 300 MG/ML solution 100 mL (100 mLs Intravenous Contrast Given 01/10/20 2046)    ED Course  I have reviewed the triage vital signs and the nursing notes.  Pertinent labs & imaging results that were available during my care of the patient were reviewed by me and considered in my medical decision making (see chart for details).    MDM Rules/Calculators/A&P                      Patient presents from urgent care with complaints of vaginal discharge is brown, copious and smells of feces.  On examination patient has hard, large abdominal mass in the right lower quadrant.  No tenderness on exam however.  She is well-appearing with normal vitals.  On speculum exam patient has copious malodorous brown vaginal discharge.  She does not have a vaginal foreign body which was removed with ring forceps.  Remaining vaginal secretions were suctioned using wall suction to remove approximately 80 cc of malodorous brown vaginal discharge.  Foreign body has likely been placed approximately 5 days.  Patient states that she believes that she took the tampon out that she is using during last MP and thinks this was 5-6 days ago.   Abdominal exam is concerning for cancerous mass.  Patient has had a bowel movement within the past 24 hours and she states that her bowel movements have been soft and regular per her normal BMs.  Doubt SBO/LBO.  She has no abdominal pain to indicate an acute abdominal pathology such as appendicitis/diverticulitis/torsion.  Her rectal bleeding with BRB PR explained by the small anal fissure that she has.   I personally reviewed all labs and patient's abdominal CT scan  CBC without leukocytosis she has mild  anemia and states she has a history of this but is uncertain when she last had blood work done.  I cannot find any comparison CBC in the EMR.  She is asymptomatic with no chest pain, shortness of breath, fatigue or lightheadedness.  I believe that she is stable for outpatient follow-up with her PCP regarding this and I will counsel patient on iron rich foods and need for follow-up work-up from PCP.  CMP unremarkable.  UA with no evidence of infection.  Urine pregnancy test negative. wet prep with many WBCs likely reactive secondary to retained foreign body.  She has no signs of staph toxic shock syndrome.  Usually patient has target cells on her CBC.  I discussed with patient that this could indicate several reasons for her anemia such as a thalassemia.  She states she has no family history of thalassemia.  She will follow up with PCP for this.  CT abdomen pelvis was obtained before removal of foreign body.  Removal of this foreign body results the issues mentioned in the first paragraph impression.  Patient does have mass enlargement of the uterus with numerous fibroids that are submucosal, intramural and subserosal.  There is very to be hyperenhancement which the radiologist indicate that suggests active degeneration at this time however as patient has very palpable abdominal mass I suspect that she may benefit from OB/GYN follow-up.  She has appointment already scheduled on the 23rd of this month I recommend that she keep this appointment and try movement if possible.  Patient reevaluated for discharge.  She continues to have no pain but states that she feels somewhat better now that the foreign body is removed.  Abdomen is soft apart from large right lower quadrant.  She is well-appearing.  As CT read had questionable evidence of pelvic congestive syndrome I discussed the symptoms of this with the patient.  She denies any pelvic pain at this time I believe she is stable for discharge and follow-up with  OB/GYN which she already has arranged.       Mary Rios was evaluated in Emergency Department on 01/11/2020 for the symptoms described in the history of present illness. She was evaluated in the context of the global COVID-19 pandemic, which necessitated consideration that the patient might be at risk for infection with the SARS-CoV-2 virus that causes COVID-19. Institutional protocols and algorithms that pertain to the evaluation of patients at risk for COVID-19 are in a state of rapid change based on information released by regulatory bodies including the CDC and federal and state organizations. These policies and algorithms were followed during the patient's care in the ED. The medical records were personally reviewed by myself. I personally reviewed all lab results and interpreted all imaging studies and either concurred with their official read or contacted radiology for clarification.  I discussed this case with my attending physician who cosigned this note including patient's presenting symptoms, physical exam, and planned diagnostics and interventions. Attending physician stated agreement with plan or made changes to plan which were implemented.  Attending physician assessed patient at bedside.  This patient appears reasonably screened and I doubt any other medical condition requiring further workup, evaluation, or treatment in the ED at this time prior to discharge.   Patient's vitals are WNL apart from vital sign abnormalities discussed above, patient is in NAD, and able to ambulate in the ED at their baseline and able to tolerate PO.  Pain has been managed or a plan has been made for home management and has no complaints prior to discharge. Patient is comfortable with above plan and for discharge at this time. All questions were answered prior to disposition. Results from the ER workup discussed with the patient face to face and all questions answered to the best of my ability. The  patient is safe for discharge with strict return precautions. Patient appears safe for discharge with appropriate follow-up. Conveyed my impression with the patient and they voiced understanding and are agreeable to plan.   An After Visit Summary was printed and given to the patient.  Portions of this note were generated with [employer]. Dictation errors may occur  despite best attempts at proofreading.     Final Clinical Impression(s) / ED Diagnoses Final diagnoses:  Foreign body in vagina, initial encounter  Vaginal discharge  Uterine leiomyoma, unspecified location  Right lower quadrant abdominal mass    Rx / DC Orders ED Discharge Orders    None       Tedd Sias, [place] 01/11/20 [id number]    Lajean Saver, MD 01/13/20 [phone]

## 2020-01-10 NOTE — ED Notes (Signed)
Two unsuccessful IV attempts.

## 2020-01-10 NOTE — ED Triage Notes (Signed)
Pt states she got off her period a few days ago and then noticed a discharge with foul odor Monday.

## 2020-01-15 LAB — GC/CHLAMYDIA PROBE AMP (~~LOC~~) NOT AT ARMC
Chlamydia: NEGATIVE
Neisseria Gonorrhea: NEGATIVE

## 2020-01-27 ENCOUNTER — Other Ambulatory Visit: Payer: Self-pay

## 2020-01-28 ENCOUNTER — Other Ambulatory Visit: Payer: Self-pay

## 2020-01-28 ENCOUNTER — Ambulatory Visit: Payer: BC Managed Care – PPO | Admitting: Women's Health

## 2020-01-28 ENCOUNTER — Encounter: Payer: Self-pay | Admitting: Women's Health

## 2020-01-28 ENCOUNTER — Ambulatory Visit (HOSPITAL_COMMUNITY)
Admission: EM | Admit: 2020-01-28 | Discharge: 2020-01-28 | Disposition: A | Discharge status: Home / Self Care | Payer: BC Managed Care – PPO | Attending: Urgent Care | Admitting: Urgent Care

## 2020-01-28 VITALS — BP 180/120 | Ht 59.0 in | Wt 127.0 lb

## 2020-01-28 DIAGNOSIS — D649 Anemia, unspecified: Secondary | ICD-10-CM | POA: Insufficient documentation

## 2020-01-28 DIAGNOSIS — N920 Excessive and frequent menstruation with regular cycle: Secondary | ICD-10-CM | POA: Diagnosis not present

## 2020-01-28 DIAGNOSIS — I160 Hypertensive urgency: Secondary | ICD-10-CM

## 2020-01-28 DIAGNOSIS — Z01419 Encounter for gynecological examination (general) (routine) without abnormal findings: Secondary | ICD-10-CM | POA: Diagnosis not present

## 2020-01-28 DIAGNOSIS — D259 Leiomyoma of uterus, unspecified: Secondary | ICD-10-CM | POA: Diagnosis not present

## 2020-01-28 DIAGNOSIS — I10 Essential (primary) hypertension: Secondary | ICD-10-CM

## 2020-01-28 DIAGNOSIS — Z8249 Family history of ischemic heart disease and other diseases of the circulatory system: Secondary | ICD-10-CM

## 2020-01-28 DIAGNOSIS — R030 Elevated blood-pressure reading, without diagnosis of hypertension: Secondary | ICD-10-CM

## 2020-01-28 DIAGNOSIS — D500 Iron deficiency anemia secondary to blood loss (chronic): Secondary | ICD-10-CM

## 2020-01-28 DIAGNOSIS — N898 Other specified noninflammatory disorders of vagina: Secondary | ICD-10-CM

## 2020-01-28 DIAGNOSIS — Z823 Family history of stroke: Secondary | ICD-10-CM

## 2020-01-28 LAB — WET PREP FOR TRICH, YEAST, CLUE

## 2020-01-28 MED ORDER — NORETHINDRONE 0.35 MG PO TABS: 1.0000 | ORAL_TABLET | Freq: Every day | ORAL | 4 refills | Status: DC

## 2020-01-28 MED ORDER — AMLODIPINE BESYLATE 5 MG PO TABS: 5.0000 mg | ORAL_TABLET | Freq: Every day | ORAL | 0 refills | Status: DC

## 2020-01-28 MED ORDER — METRONIDAZOLE 500 MG PO TABS: 500.0000 mg | ORAL_TABLET | Freq: Two times a day (BID) | ORAL | 0 refills | Status: DC

## 2020-01-28 NOTE — ED Provider Notes (Signed)
[place]   MRN: [id number] DOB: [birth year]  Subjective:   Mary Rios is a 44 y.o. female presenting for check on her blood pressure.  Patient was at her gynecologist and they had significant concern for blood pressure.  They advised that she come in to get it checked.  She does not have a PCP right now.  She has previously had elevated readings but has not tried to get it under control.  Patient admits dietary noncompliance.  Has an extensive family history of high blood pressure in her mother and siblings.  Fever onset uncles have also had stroke and heart attack.  Patient is not a smoker.  No current facility-administered medications for this encounter.  Current Outpatient Medications:  .  metroNIDAZOLE (FLAGYL) 500 MG tablet, Take 1 tablet (500 mg total) by mouth 2 (two) times daily., Disp: 14 tablet, Rfl: 0 .  norethindrone (ORTHO MICRONOR) 0.35 MG tablet, Take 1 tablet (0.35 mg total) by mouth daily., Disp: 3 Package, Rfl: 4 .  Multiple Vitamin (MULTIVITAMIN) capsule, Take 1 capsule by mouth daily., Disp: , Rfl:    No Known Allergies  Past Medical History:  Diagnosis Date  . Fibroids      Past Surgical History:  Procedure Laterality Date  . CESAREAN SECTION      Family History  Problem Relation Age of Onset  . Hypertension Mother   . Diabetes Mother     Social History   Tobacco Use  . Smoking status: Never Smoker  . Smokeless tobacco: Never Used  Substance Use Topics  . Alcohol use: Not Currently  . Drug use: No    Review of Systems  Constitutional: Negative for fever and malaise/fatigue.  HENT: Negative for congestion, ear pain, sinus pain and sore throat.   Eyes: Negative for blurred vision, double vision, discharge and redness.  Respiratory: Negative for cough, hemoptysis, shortness of breath and wheezing.   Cardiovascular: Negative for chest pain.  Gastrointestinal: Negative for abdominal pain, diarrhea, nausea and vomiting.  Genitourinary:  Negative for dysuria, flank pain and hematuria.  Musculoskeletal: Negative for myalgias.  Skin: Negative for rash.  Neurological: Negative for dizziness, weakness and headaches.  Psychiatric/Behavioral: Negative for depression and substance abuse.     Objective:   Vitals: BP (!) 182/87 (BP Location: Right Arm)   Pulse 90   Temp 98.2 F (36.8 C) (Oral)   Resp 14   Wt 133 lb (60.3 kg)   LMP 01/13/2020   SpO2 100%   BMI 26.86 kg/m   BP Readings from Last 3 Encounters:  01/28/20 (!) 182/87  01/28/20 (!) 180/120  01/10/20 (!) 175/88   Physical Exam Constitutional:      General: She is not in acute distress.    Appearance: Normal appearance. She is well-developed. She is not ill-appearing, toxic-appearing or diaphoretic.  HENT:     Head: Normocephalic and atraumatic.     Nose: Nose normal.     Mouth/Throat:     Mouth: Mucous membranes are moist.  Eyes:     General: No scleral icterus.       Right eye: No discharge.        Left eye: No discharge.     Extraocular Movements: Extraocular movements intact.     Pupils: Pupils are equal, round, and reactive to light.  Cardiovascular:     Rate and Rhythm: Normal rate and regular rhythm.     Pulses: Normal pulses.     Heart sounds: Normal heart sounds. No  murmur. No friction rub. No gallop.   Pulmonary:     Effort: Pulmonary effort is normal. No respiratory distress.     Breath sounds: Normal breath sounds. No stridor. No wheezing, rhonchi or rales.  Skin:    General: Skin is warm and dry.     Findings: No rash.  Neurological:     Mental Status: She is alert and oriented to person, place, and time.     Cranial Nerves: No cranial nerve deficit.     Motor: No weakness.     Coordination: Coordination normal.     Gait: Gait normal.     Deep Tendon Reflexes: Reflexes normal.     Comments: Negative Romberg and pronator drift.  Psychiatric:        Mood and Affect: Mood normal.        Behavior: Behavior normal.        Thought  Content: Thought content normal.        Judgment: Judgment normal.      Results for orders placed or performed in visit on 01/28/20 (from the past 24 hour(s))  WET PREP FOR TRICH, YEAST, CLUE     Status: None   Collection Time: 01/28/20 11:38 AM   Specimen: Genital  Result Value Ref Range   Source: VAGINA    RESULT      Assessment and Plan :   1. Hypertensive urgency   2. Essential hypertension   3. Elevated blood pressure reading   4. Family history of hypertension   5. Family history of stroke   6. Family history of MI (myocardial infarction)     Counseled patient on need for aggressive management of her blood pressure, hypertensive urgency.  Discussed signs of stroke, MI and will have patient maintain strict ER precautions.  She is to start amlodipine and follow-up ASAP with a new PCP, she is to establish care through [facility].  Counseled that she should return here if she has not been seen by them and the next couple of weeks. Counseled patient on potential for adverse effects with medications prescribed/recommended today, ER and return-to-clinic precautions discussed, patient verbalized understanding.    Jaynee Eagles, PA-C 01/28/20 1327

## 2020-01-28 NOTE — Discharge Instructions (Addendum)
For elevated blood pressure, make sure you are monitoring salt in your diet.  Do not eat restaurant foods and limit processed foods at home, prepare/cook your own foods at home.  Processed foods include things like frozen meals preseasoned meats and dinners, deli meats, canned foods as they are high in sodium/salt.  Make sure your pain attention to sodium labels on foods you by at the grocery store.  For seasoning you can use a brand called Mrs. Dash which includes a lot of salt free seasonings.  Salads - kale, spinach, cabbage, spring mix; use seeds like pumpkin seeds or sunflower seeds, almonds; you can also use 1-2 hard boiled eggs in your salads Fruits - avocadoes, berries (blueberries, raspberries, blackberries), apples, oranges Vegetables - aspargus, cauliflower, broccoli, green beans, brussel spouts, bell peppers; stay away from starchy vegetables like potatoes, carrots, peas  Regarding meat it is better to eat lean meats and limit your red meat consumption including pork.  Wild caught fish, chicken breast are good options.  Do not eat any foods on this list that you are allergic to.

## 2020-01-28 NOTE — Patient Instructions (Addendum)
URGENT CARE  180/120 on 01/10/20  175/88  Health Maintenance, Female Adopting a healthy lifestyle and getting preventive care are important in promoting health and wellness. Ask your health care provider about:  The right schedule for you to have regular tests and exams.  Things you can do on your own to prevent diseases and keep yourself healthy. What should I know about diet, weight, and exercise? Eat a healthy diet   Eat a diet that includes plenty of vegetables, fruits, low-fat dairy products, and lean protein.  Do not eat a lot of foods that are high in solid fats, added sugars, or sodium. Maintain a healthy weight Body mass index (BMI) is used to identify weight problems. It estimates body fat based on height and weight. Your health care provider can help determine your BMI and help you achieve or maintain a healthy weight. Get regular exercise Get regular exercise. This is one of the most important things you can do for your health. Most adults should:  Exercise for at least 150 minutes each week. The exercise should increase your heart rate and make you sweat (moderate-intensity exercise).  Do strengthening exercises at least twice a week. This is in addition to the moderate-intensity exercise.  Spend less time sitting. Even light physical activity can be beneficial. Watch cholesterol and blood lipids Have your blood tested for lipids and cholesterol at 44 years of age, then have this test every 5 years. Have your cholesterol levels checked more often if:  Your lipid or cholesterol levels are high.  You are older than 44 years of age.  You are at high risk for heart disease. What should I know about cancer screening? Depending on your health history and family history, you may need to have cancer screening at various ages. This may include screening for:  Breast cancer.  Cervical cancer.  Colorectal cancer.  Skin cancer.  Lung cancer. What should I know about heart  disease, diabetes, and high blood pressure? Blood pressure and heart disease  High blood pressure causes heart disease and increases the risk of stroke. This is more likely to develop in people who have high blood pressure readings, are of African descent, or are overweight.  Have your blood pressure checked: ? Every 3-5 years if you are 92-35 years of age. ? Every year if you are 76 years old or older. Diabetes Have regular diabetes screenings. This checks your fasting blood sugar level. Have the screening done:  Once every three years after age 19 if you are at a normal weight and have a low risk for diabetes.  More often and at a younger age if you are overweight or have a high risk for diabetes. What should I know about preventing infection? Hepatitis B If you have a higher risk for hepatitis B, you should be screened for this virus. Talk with your health care provider to find out if you are at risk for hepatitis B infection. Hepatitis C Testing is recommended for:  Everyone born from [birth year] through 1965.  Anyone with known risk factors for hepatitis C. Sexually transmitted infections (STIs)  Get screened for STIs, including gonorrhea and chlamydia, if: ? You are sexually active and are younger than 44 years of age. ? You are older than 44 years of age and your health care provider tells you that you are at risk for this type of infection. ? Your sexual activity has changed since you were last screened, and you are at increased risk for  chlamydia or gonorrhea. Ask your health care provider if you are at risk.  Ask your health care provider about whether you are at high risk for HIV. Your health care provider may recommend a prescription medicine to help prevent HIV infection. If you choose to take medicine to prevent HIV, you should first get tested for HIV. You should then be tested every 3 months for as long as you are taking the medicine. Pregnancy  If you are about to stop  having your period (premenopausal) and you may become pregnant, seek counseling before you get pregnant.  Take 400 to 800 micrograms (mcg) of folic acid every day if you become pregnant.  Ask for birth control (contraception) if you want to prevent pregnancy. Osteoporosis and menopause Osteoporosis is a disease in which the bones lose minerals and strength with aging. This can result in bone fractures. If you are 63 years old or older, or if you are at risk for osteoporosis and fractures, ask your health care provider if you should:  Be screened for bone loss.  Take a calcium or vitamin D supplement to lower your risk of fractures.  Be given hormone replacement therapy (HRT) to treat symptoms of menopause. Follow these instructions at home: Lifestyle  Do not use any products that contain nicotine or tobacco, such as cigarettes, e-cigarettes, and chewing tobacco. If you need help quitting, ask your health care provider.  Do not use street drugs.  Do not share needles.  Ask your health care provider for help if you need support or information about quitting drugs. Alcohol use  Do not drink alcohol if: ? Your health care provider tells you not to drink. ? You are pregnant, may be pregnant, or are planning to become pregnant.  If you drink alcohol: ? Limit how much you use to 0-1 drink a day. ? Limit intake if you are breastfeeding.  Be aware of how much alcohol is in your drink. In the [place] one drink equals one 12 oz bottle of beer (355 mL), one 5 oz glass of wine (148 mL), or one 1 oz glass of hard liquor (44 mL). General instructions  Schedule regular health, dental, and eye exams.  Stay current with your vaccines.  Tell your health care provider if: ? You often feel depressed. ? You have ever been abused or do not feel safe at home. Summary  Adopting a healthy lifestyle and getting preventive care are important in promoting health and wellness.  Follow your health  care provider's instructions about healthy diet, exercising, and getting tested or screened for diseases.  Follow your health care provider's instructions on monitoring your cholesterol and blood pressure. This information is not intended to replace advice given to you by your health care provider. Make sure you discuss any questions you have with your health care provider. Document Revised: 10/17/2018 Document Reviewed: 10/17/2018 Elsevier Patient Education  2020 [employer].  Breast center [phone] Slow Fe  Daily and eat dark green veg daily Nice to meet you  Vit D 1000 iu  DASH Eating Plan DASH stands for "Dietary Approaches to Stop Hypertension." The DASH eating plan is a healthy eating plan that has been shown to reduce high blood pressure (hypertension). It may also reduce your risk for type 2 diabetes, heart disease, and stroke. The DASH eating plan may also help with weight loss. What are tips for following this plan?  General guidelines  Avoid eating more than 2,300 mg (milligrams) of salt (sodium) a  day. If you have hypertension, you may need to reduce your sodium intake to 1,500 mg a day.  Limit alcohol intake to no more than 1 drink a day for nonpregnant women and 2 drinks a day for men. One drink equals 12 oz of beer, 5 oz of wine, or 1 oz of hard liquor.  Work with your health care provider to maintain a healthy body weight or to lose weight. Ask what an ideal weight is for you.  Get at least 30 minutes of exercise that causes your heart to beat faster (aerobic exercise) most days of the week. Activities may include walking, swimming, or biking.  Work with your health care provider or diet and nutrition specialist (dietitian) to adjust your eating plan to your individual calorie needs. Reading food labels   Check food labels for the amount of sodium per serving. Choose foods with less than 5 percent of the Daily Value of sodium. Generally, foods with less than 300 mg of  sodium per serving fit into this eating plan.  To find whole grains, look for the word "whole" as the first word in the ingredient list. Shopping  Buy products labeled as "low-sodium" or "no salt added."  Buy fresh foods. Avoid canned foods and premade or frozen meals. Cooking  Avoid adding salt when cooking. Use salt-free seasonings or herbs instead of table salt or sea salt. Check with your health care provider or pharmacist before using salt substitutes.  Do not fry foods. Cook foods using healthy methods such as baking, boiling, grilling, and broiling instead.  Cook with heart-healthy oils, such as olive, canola, soybean, or sunflower oil. Meal planning  Eat a balanced diet that includes: ? 5 or more servings of fruits and vegetables each day. At each meal, try to fill half of your plate with fruits and vegetables. ? Up to 6-8 servings of whole grains each day. ? Less than 6 oz of lean meat, poultry, or fish each day. A 3-oz serving of meat is about the same size as a deck of cards. One egg equals 1 oz. ? 2 servings of low-fat dairy each day. ? A serving of nuts, seeds, or beans 5 times each week. ? Heart-healthy fats. Healthy fats called Omega-3 fatty acids are found in foods such as flaxseeds and coldwater fish, like sardines, salmon, and mackerel.  Limit how much you eat of the following: ? Canned or prepackaged foods. ? Food that is high in trans fat, such as fried foods. ? Food that is high in saturated fat, such as fatty meat. ? Sweets, desserts, sugary drinks, and other foods with added sugar. ? Full-fat dairy products.  Do not salt foods before eating.  Try to eat at least 2 vegetarian meals each week.  Eat more home-cooked food and less restaurant, buffet, and fast food.  When eating at a restaurant, ask that your food be prepared with less salt or no salt, if possible. What foods are recommended? The items listed may not be a complete list. Talk with your  dietitian about what dietary choices are best for you. Grains Whole-grain or whole-wheat bread. Whole-grain or whole-wheat pasta. Brown rice. [place]. Bulgur. Whole-grain and low-sodium cereals. Pita bread. Low-fat, low-sodium crackers. Whole-wheat flour tortillas. Vegetables Fresh or frozen vegetables (raw, steamed, roasted, or grilled). Low-sodium or reduced-sodium tomato and vegetable juice. Low-sodium or reduced-sodium tomato sauce and tomato paste. Low-sodium or reduced-sodium canned vegetables. Fruits All fresh, dried, or frozen fruit. Canned fruit in natural juice (  without added sugar). Meat and other protein foods Skinless chicken or [place]. Ground chicken or [place]. Pork with fat trimmed off. Fish and seafood. Egg whites. Dried beans, peas, or lentils. Unsalted nuts, nut butters, and seeds. Unsalted canned beans. Lean cuts of beef with fat trimmed off. Low-sodium, lean deli meat. Dairy Low-fat (1%) or fat-free (skim) milk. Fat-free, low-fat, or reduced-fat cheeses. Nonfat, low-sodium ricotta or cottage cheese. Low-fat or nonfat yogurt. Low-fat, low-sodium cheese. Fats and oils Soft margarine without trans fats. Vegetable oil. Low-fat, reduced-fat, or light mayonnaise and salad dressings (reduced-sodium). Canola, safflower, olive, soybean, and sunflower oils. Avocado. Seasoning and other foods Herbs. Spices. Seasoning mixes without salt. Unsalted popcorn and pretzels. Fat-free sweets. What foods are not recommended? The items listed may not be a complete list. Talk with your dietitian about what dietary choices are best for you. Grains Baked goods made with fat, such as croissants, muffins, or some breads. Dry pasta or rice meal packs. Vegetables Creamed or fried vegetables. Vegetables in a cheese sauce. Regular canned vegetables (not low-sodium or reduced-sodium). Regular canned tomato sauce and paste (not low-sodium or reduced-sodium). Regular tomato and vegetable juice (not  low-sodium or reduced-sodium). Angie Fava. Olives. Fruits Canned fruit in a light or heavy syrup. Fried fruit. Fruit in cream or butter sauce. Meat and other protein foods Fatty cuts of meat. Ribs. Fried meat. Berniece Salines. Sausage. Bologna and other processed lunch meats. Salami. Fatback. Hotdogs. Bratwurst. Salted nuts and seeds. Canned beans with added salt. Canned or smoked fish. Whole eggs or egg yolks. Chicken or [place] with skin. Dairy Whole or 2% milk, cream, and half-and-half. Whole or full-fat cream cheese. Whole-fat or sweetened yogurt. Full-fat cheese. Nondairy creamers. Whipped toppings. Processed cheese and cheese spreads. Fats and oils Butter. Stick margarine. Lard. Shortening. Ghee. Bacon fat. Tropical oils, such as coconut, palm kernel, or palm oil. Seasoning and other foods Salted popcorn and pretzels. Onion salt, garlic salt, seasoned salt, table salt, and sea salt. Worcestershire sauce. Tartar sauce. Barbecue sauce. Teriyaki sauce. Soy sauce, including reduced-sodium. Steak sauce. Canned and packaged gravies. Fish sauce. Oyster sauce. Cocktail sauce. Horseradish that you find on the shelf. Ketchup. Mustard. Meat flavorings and tenderizers. Bouillon cubes. Hot sauce and Tabasco sauce. Premade or packaged marinades. Premade or packaged taco seasonings. Relishes. Regular salad dressings. Where to find more information:  National Heart, Lung, and [place]: https://wilson-eaton.com/  American Heart Association: www.heart.org Summary  The DASH eating plan is a healthy eating plan that has been shown to reduce high blood pressure (hypertension). It may also reduce your risk for type 2 diabetes, heart disease, and stroke.  With the DASH eating plan, you should limit salt (sodium) intake to 2,300 mg a day. If you have hypertension, you may need to reduce your sodium intake to 1,500 mg a day.  When on the DASH eating plan, aim to eat more fresh fruits and vegetables, whole grains, lean proteins,  low-fat dairy, and heart-healthy fats.  Work with your health care provider or diet and nutrition specialist (dietitian) to adjust your eating plan to your individual calorie needs. This information is not intended to replace advice given to you by your health care provider. Make sure you discuss any questions you have with your health care provider. Document Revised: 10/06/2017 Document Reviewed: 10/17/2016 Elsevier Patient Education  2020 [employer].

## 2020-01-28 NOTE — Progress Notes (Addendum)
[employer] 03-13-1976 [id number]    History:    Presents for new patient for annual exam and follow-up from ER visit on 01/10/2020  for pelvic pain,and  vaginal discharge.  Diagnosed with trichomonas and retained tampon, negative gonorrhea/chlamydia.  Was found to have 11 x 10 cm fibroid and a pedunculated fibroid on abdominal CT.  Hemoglobin hematocrit  Was 7.6 and 26.4.  Blood pressure 175/88.  Has never had a mammogram and last Pap many years ago.  Monthly cycle becoming more heavy changing protection every 1-2 hours and with increased menstrual cramping/BTL.  Past medical history, past surgical history, family history and social history were all reviewed and documented in the EPIC chart.  Has 3 daughters ages 2, 50 and 88 all doing well and have had Gardasil.  Works at [employer].  Mother hypertension and diabetes.    ROS:  A ROS was performed and pertinent positives and negatives are included.  Exam:  Vitals:   01/28/20 1104  BP: (!) 180/120  Weight: 127 lb (57.6 kg)  Height: 4\' 11"  (1.499 m)   Body mass index is 25.65 kg/m.   General appearance:  Normal Thyroid:  Symmetrical, normal in size, without palpable masses or nodularity. Respiratory  Auscultation:  Clear without wheezing or rhonchi Cardiovascular  Auscultation:  Regular rate, without rubs, murmurs or gallops  Edema/varicosities:  Not grossly evident Abdominal  Soft,nontender, without masses, guarding or rebound.  Liver/spleen:  No organomegaly noted  Hernia:  None appreciated  Skin  Inspection:  Grossly normal   Breasts: Examined lying and sitting.     Right: Without masses, retractions, discharge or axillary adenopathy.     Left: Without masses, retractions, discharge or axillary adenopathy. Gentitourinary   Inguinal/mons:  Normal without inguinal adenopathy  External genitalia:  Normal  BUS/Urethra/Skene's glands:  Normal  Vagina: Moderate watery discharge noted wet prep positive for clues, TNTC  bacteria   Cervix:  Normal  Uterus: Firm uterus 22 weeks size nontender   Adnexa/parametria:     Rt: Without masses or tenderness.   Lt: Without masses or tenderness.  Anus and perineum: Normal  Digital rectal exam: Normal sphincter tone without palpated masses or tenderness  Assessment/Plan:  44 y.o. S BF G3 P3 for annual exam.     Monthly cycle/menorrhagia/anemia/BTL 11 cm fibroid- uterus Undiagnosed hypertension Bacterial vaginosis   Plan: Flagyl 500 twice daily for 7 days, alcohol precautions reviewed.  Instructed to call if no relief at discharge.  Reviewed importance of over-the-counter iron supplement Slow Fe encouraged start with 1 daily increase to 2 as tolerates, increase iron rich foods in diet daily.  Reviewed severe hypertension needs to follow-up today at an urgent care for medication administration states will schedule at one close to her home.  Reviewed importance of a low-sodium diet, regular exercise and avoiding processed foods.  Reviewed fibroid is quite large and although benign probable cause of anemia due to menorrhagia.  Will schedule ultrasound with Dr. Delilah Shan and discuss possible treatment or hysterectomy.  Reviewed fibroid is palpable abdominally and reports it has enlarged over the past few months.  Options for menorrhagia reviewed we will started on Micronor 1 p.o. daily with next cycle taking daily no placebo week reviewed.  Reviewed minimal risks for elevated blood pressure.  SBEs, reviewed importance of annual screening mammogram, breast center information given, instructed to schedule ASAP.  Pap with HR HPV typing, screening guidelines reviewed.  Telephone call for follow-up at 5:00 states did see urgent care physician  was started on Norvasc 5 mg and has scheduled follow-up.      Huel Cote [facility], 5:31 PM 01/28/2020

## 2020-01-28 NOTE — ED Triage Notes (Signed)
Pt states he was just at her OBGYN office and she was told to stop by here and get her B/P checked again.

## 2020-01-30 LAB — PAP, TP IMAGING W/ HPV RNA, RFLX HPV TYPE 16,18/45: HPV DNA High Risk: NOT DETECTED

## 2020-02-05 ENCOUNTER — Other Ambulatory Visit: Payer: Self-pay

## 2020-02-06 ENCOUNTER — Other Ambulatory Visit: Payer: Self-pay | Admitting: Obstetrics and Gynecology

## 2020-02-06 ENCOUNTER — Ambulatory Visit: Payer: BC Managed Care – PPO | Admitting: Obstetrics and Gynecology

## 2020-02-06 ENCOUNTER — Encounter: Payer: Self-pay | Admitting: Obstetrics and Gynecology

## 2020-02-06 ENCOUNTER — Ambulatory Visit (INDEPENDENT_AMBULATORY_CARE_PROVIDER_SITE_OTHER): Payer: BC Managed Care – PPO

## 2020-02-06 VITALS — BP 118/76

## 2020-02-06 DIAGNOSIS — D509 Iron deficiency anemia, unspecified: Secondary | ICD-10-CM

## 2020-02-06 DIAGNOSIS — N852 Hypertrophy of uterus: Secondary | ICD-10-CM

## 2020-02-06 DIAGNOSIS — N946 Dysmenorrhea, unspecified: Secondary | ICD-10-CM

## 2020-02-06 DIAGNOSIS — D259 Leiomyoma of uterus, unspecified: Secondary | ICD-10-CM | POA: Diagnosis not present

## 2020-02-06 DIAGNOSIS — N920 Excessive and frequent menstruation with regular cycle: Secondary | ICD-10-CM | POA: Diagnosis not present

## 2020-02-06 NOTE — Progress Notes (Signed)
Mary Rios  [birth year] [id number]  HPI The patient is a 44 y.o. G3P0003 who presents today for a pelvic ultrasound.  It was noted at her routine annual exam that she had recently had an ER visit for pelvic pain and vaginal discharge.  She had a trichomonas infection and a retained tampon.  On CT imaging she was found to have an 11 x 10 cm fibroid.  She also has been noted to be anemic with a hemoglobin of 7.6 which was taken right after she had finished her period.  She was severely hypertensive at her last visit and has since started treatment.  She previously had a bilateral tubal ligation.  She finds that her monthly period has been be getting more heavy but it does remain regular and monthly.  She does find she is changing sanitary products every 1-2 hours for 5 days then bleeding tapers over 2 more days.  She has had increasingly painful and crampy periods over the last several months, she uses a heating pad and Midol to get through the discomfort.  Cesarean sections x 3.  Past medical history,surgical history, problem list, medications, allergies, family history and social history were all reviewed and documented as reviewed in the EPIC chart.  ROS:  Feeling well. No dyspnea or chest pain on exertion.  No abdominal pain, change in bowel habits, black or bloody stools.  No change in appetite.  No urinary tract symptoms. GYN ROS: + Heavy menses and dysmenorrhea, pelvic pain or discharge, no breast pain or new or enlarging lumps on self exam. No neurological complaints.  Physical Exam  BP 118/76   LMP 01/13/2020   General: Pleasant female, no acute distress, alert and oriented PELVIC EXAM: Deferred  Pelvic ultrasound Grossly enlarged uterus 17.9 x 14.2 x 8.7 cm, extending superior to the umbilicus Multiple large fibroids in intramural, submucosal, and pedunculated locations. Largest fibroid measures 7.9 x 7.0 x 7.4 cm located in the mid uterine body with intramural and submucosal  components Endometrial cavity is obscured by surrounding fibroids Bilateral ovaries appeared normal No adnexal masses.  No free fluid.  Assessment 44 yo G3P3 with menorrhagia/abnormal uterine bleeding and significantly enlarged fibroid uterus with dysmenorrhea and anemia presumably due to heavy menses  Plan We used diagrams to illustrate fibroids.  She is aware that she has a very large uterus with enlargement due to the fibroids.  She has been anemic due to the amount of menstrual bleeding that she has.  She has been trying to follow an iron rich diet.  She is not having any irregular bleeding.  Her dysmenorrhea is somewhat manageable by conservative methods to this point.  Discussed management options to include hormonal contraception.  Not a good candidate for IUD trial given distortion of the endometrial cavity with the very large fibroids.  Her conservative management options include tranexamic acid and scheduled ibuprofen use.  He has been on Depo-Provera injections in the past and tolerated them fine.  We discussed that this can lead to reversible bone density loss and as she is in her 22s it is recommended that if she were to start on Depo-Provera at some point, that she only use it for a few years before coming off and allowing bones to remineralize.  Other potential options include oral contraceptive pills and Nexplanon.  She understands that with the significant uterine enlargement and the fibroids, she may end up requiring hysterectomy.  Given the very large size of the uterus in its current  state, this would require laparotomy by midline vertical approach.  I suggested that if she does decide that she wants or ends up needing a hysterectomy, that we consider preoperative Lupron therapy for at least 3 to 6 months to try to reduce the fibroid burden and increase the likelihood that we could perform the hysterectomy by minimally invasive methods.  For now, she would like to opt for  conservative management.  We will try the Lysteda 1300 mg 3 times daily for 5 days, beginning when menses starts.  Also suggested using ibuprofen 400 to 600 mg 4 times daily for 5 days, beginning just before menses is expected to start.  She denies any prior history of thromboembolic diseases or migraine headaches with aura.  Follow-up in 2 to 3 months   Joseph Pierini MD, [facility] 02/06/20

## 2020-02-06 NOTE — Patient Instructions (Signed)
Tranexamic acid oral tablets What is this medicine? TRANEXAMIC ACID (TRAN ex AM ik AS id) slows down or stops blood clots from being broken down. This medicine is used to treat heavy monthly menstrual bleeding. This medicine may be used for other purposes; ask your health care provider or pharmacist if you have questions. COMMON BRAND NAME(S): Cyklokapron, Lysteda What should I tell my health care provider before I take this medicine? They need to know if you have any of these conditions:  bleeding in the brain  blood clotting problems  kidney disease  vision problems  an unusual allergic reaction to tranexamic acid, other medicines, foods, dyes, or preservatives  pregnant or trying to get pregnant  breast-feeding How should I use this medicine? Take this medicine by mouth with a glass of water. Follow the directions on the prescription label. Do not cut, crush, or chew this medicine. You can take it with or without food. If it upsets your stomach, take it with food. Take your medicine at regular intervals. Do not take it more often than directed. Do not stop taking except on your doctor's advice. Do not take this medicine until your period has started. Do not take it for more than 5 days in a row. Do not take this medicine when you do not have your period. Talk to your pediatrician regarding the use of this medicine in children. While this drug may be prescribed for female children as young as 71 years of age for selected conditions, precautions do apply. Overdosage: If you think you have taken too much of this medicine contact a poison control center or emergency room at once. NOTE: This medicine is only for you. Do not share this medicine with others. What if I miss a dose? If you miss a dose, take it when you remember, and then take your next dose at least 6 hours later. Do not take more than 2 tablets at a time to make up for missed doses. What may interact with this medicine? Do  not take this medicine with any of the following medications:  estrogens  birth control pills, patches, injections, rings or other devices that contain both an estrogen and a progestin This medicine may also interact with the following medications:  certain medicines used to help your blood clot  tretinoin (taken by mouth) This list may not describe all possible interactions. Give your health care provider a list of all the medicines, herbs, non-prescription drugs, or dietary supplements you use. Also tell them if you smoke, drink alcohol, or use illegal drugs. Some items may interact with your medicine. What should I watch for while using this medicine? Tell your doctor or healthcare professional if your symptoms do not start to get better or if they get worse. Tell your doctor or healthcare professional if you notice any eye problems while taking this medicine. Your doctor will refer you to an eye doctor who will examine your eyes. What side effects may I notice from receiving this medicine? Side effects that you should report to your doctor or health care professional as soon as possible:  allergic reactions like skin rash, itching or hives, swelling of the face, lips, or tongue  breathing difficulties  changes in vision  sudden or severe pain in the chest, legs, head, or groin  unusually weak or tired Side effects that usually do not require medical attention (report to your doctor or health care professional if they continue or are bothersome):  back pain  headache  muscle or joint aches  sinus and nasal problems  stomach pain  tiredness This list may not describe all possible side effects. Call your doctor for medical advice about side effects. You may report side effects to FDA at 1-800-FDA-1088. Where should I keep my medicine? Keep out of the reach of children. Store at room temperature between 15 and 30 degrees C (59 and 86 degrees F). Throw away any unused  medicine after the expiration date. NOTE: This sheet is a summary. It may not cover all possible information. If you have questions about this medicine, talk to your doctor, pharmacist, or health care provider.  2020 Elsevier/Gold Standard (2015-11-26 09:12:15)

## 2020-03-06 ENCOUNTER — Other Ambulatory Visit: Payer: Self-pay

## 2020-03-09 ENCOUNTER — Other Ambulatory Visit: Payer: Self-pay

## 2020-03-09 ENCOUNTER — Ambulatory Visit: Payer: BC Managed Care – PPO | Admitting: Obstetrics and Gynecology

## 2020-03-11 ENCOUNTER — Ambulatory Visit: Payer: BC Managed Care – PPO | Admitting: Obstetrics and Gynecology

## 2021-06-06 IMAGING — DX DG ABDOMEN 2V
2 series · 2 of 2 positions shown · non-contrast
Comparison: No prior radiographic or cross-sectional imaging.

CLINICAL DATA: Palpable right lower abdominal mass.

EXAM:
ABDOMEN - 2 VIEW

[abdomen erect]
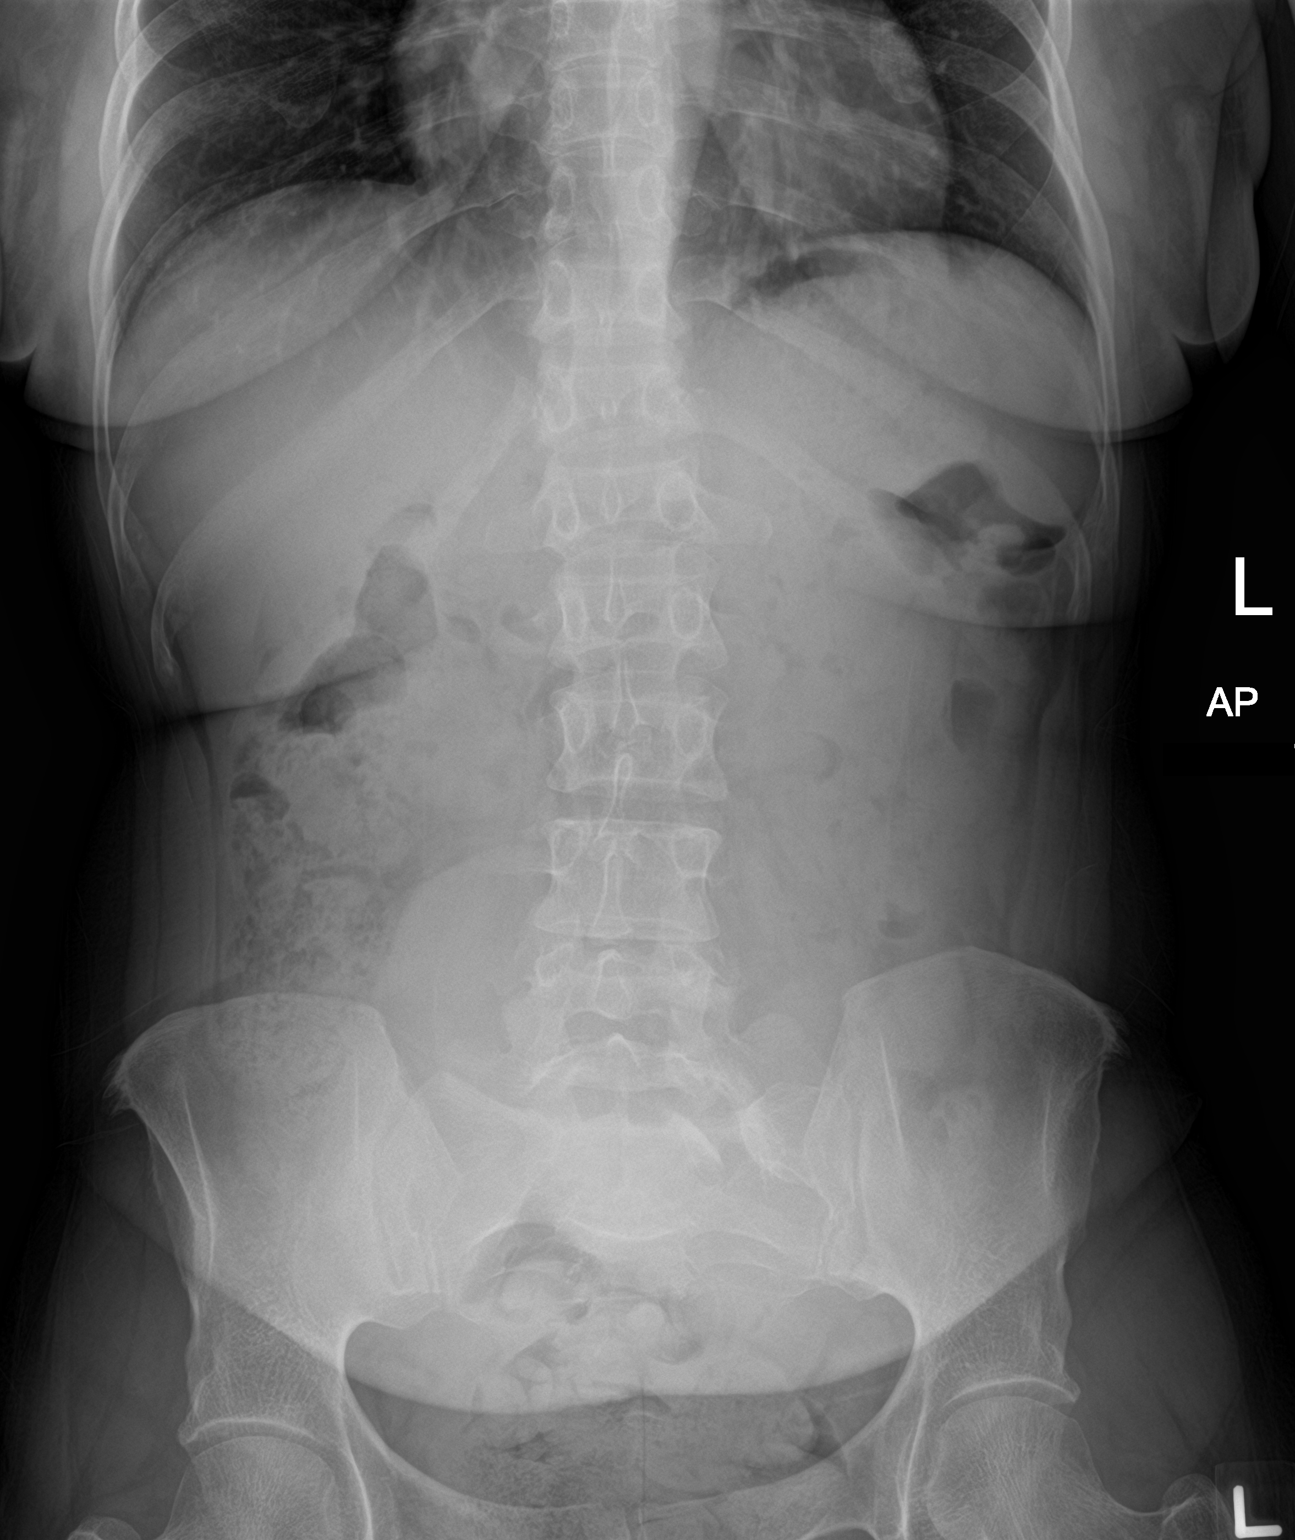

[abdomen supine]
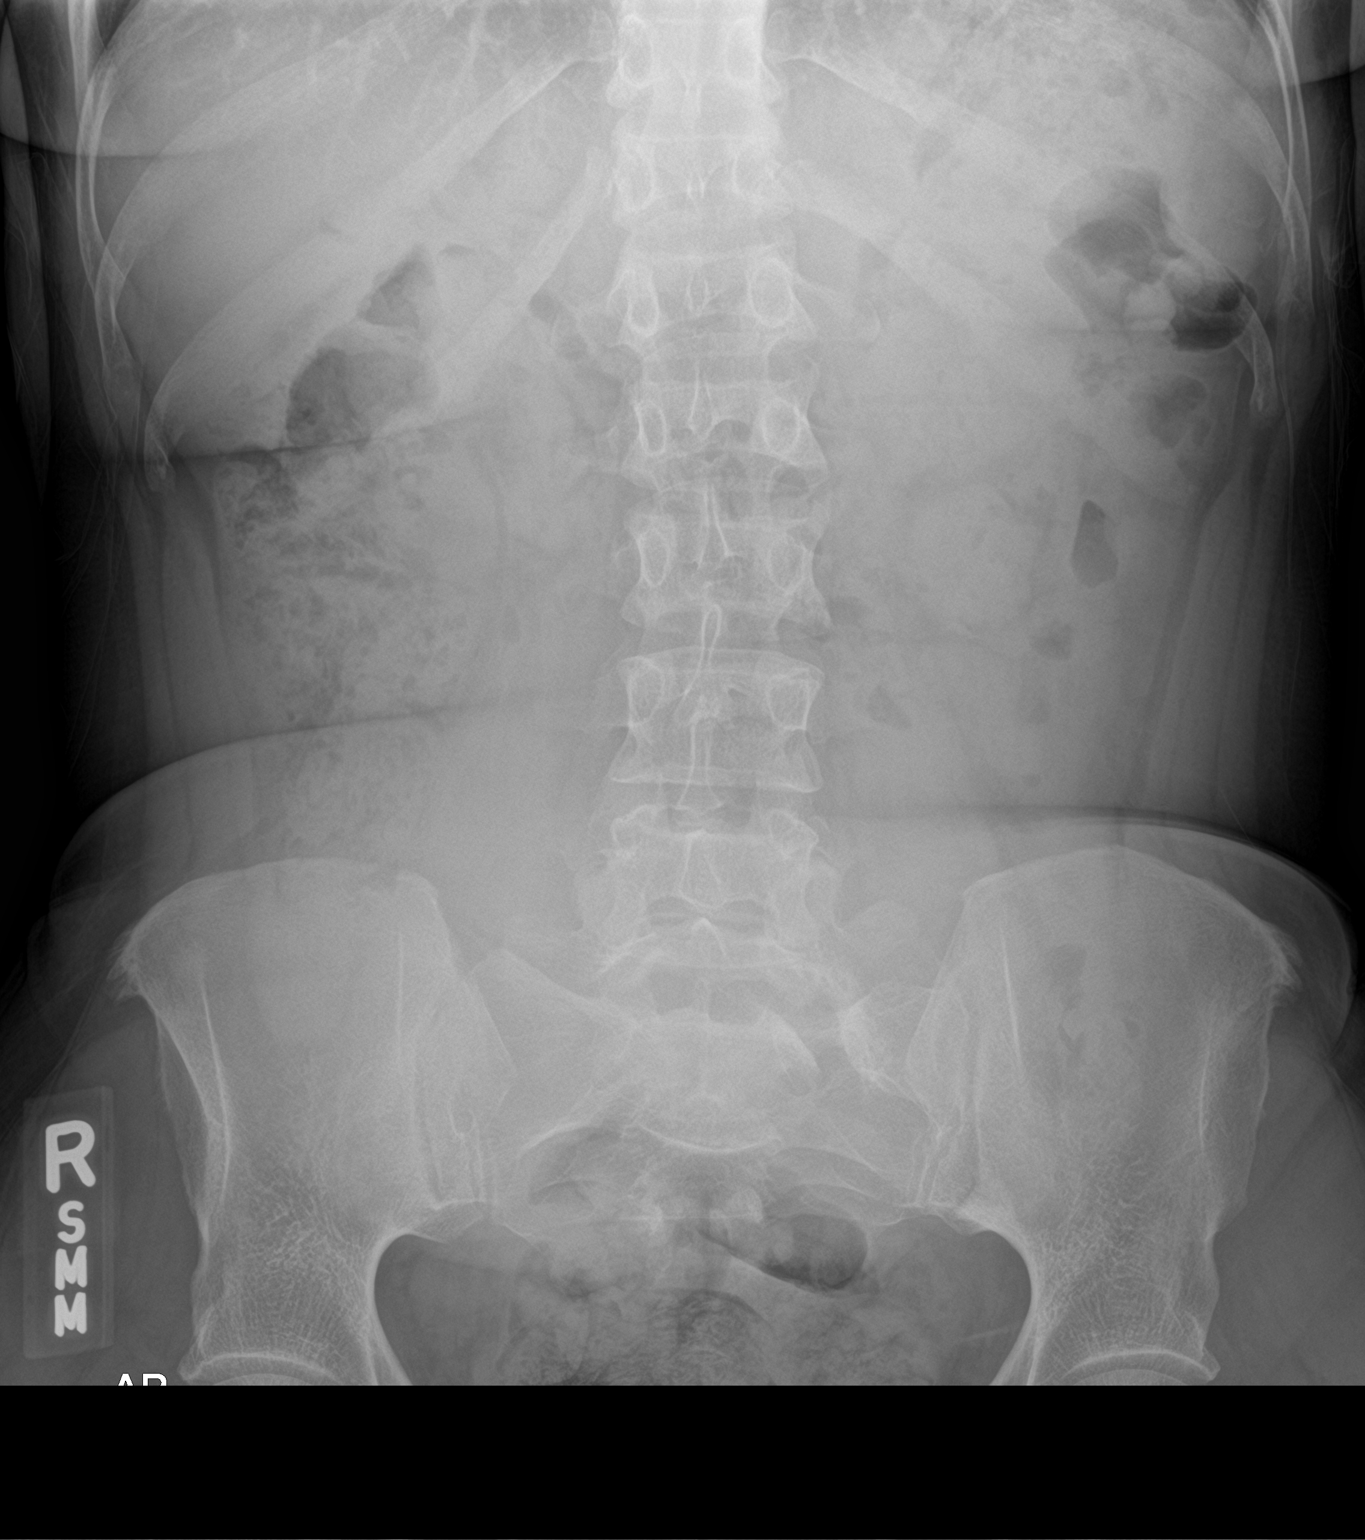

[2 of 2 positions shown; findings below may reference images not displayed]

FINDINGS: A large amount of stool distends the rectum. Stool is present
throughout the colon including in the right lower quadrant cecum and
distal sigmoid colon. A smoothly contoured 6 x 4 cm ovoid soft
tissue density projects on the right lower paraspinal region,
incompletely characterized on radiography. There is borderline
cardiomegaly.
IMPRESSION: A smoothly contoured ovoid 6 x 4 cm soft tissue density projecting
on the right lower quadrant, incompletely characterized on
radiography. Further evaluation with CT or MR abdomen pelvis
recommended, preferably with intravenous contrast.

Large stool burden without bowel obstruction.

## 2021-06-07 IMAGING — CT CT ABD-PELV W/ CM
2 of 5 series · 14 of 46 positions shown, 16 images · IV contrast (APPLIED)
Comparison: Abdominal radiograph 01/09/2019

CLINICAL DATA: Palpable right lower quadrant mass. Malodorous
vaginal discharge.

EXAM:
CT ABDOMEN AND PELVIS WITH CONTRAST
TECHNIQUE: Multidetector CT imaging of the abdomen and pelvis was performed
using the standard protocol following bolus administration of
intravenous contrast.
CONTRAST:  100mL OMNIPAQUE IOHEXOL 300 MG/ML  SOLN

[Series 4: abd/ pelvis 5.0 i30f 2 · axial · 0.70mm/px · z∈[+781,+1151]mm · 11 of 84 slices shown, 13 images]
[im 5/84  soft-tissue]
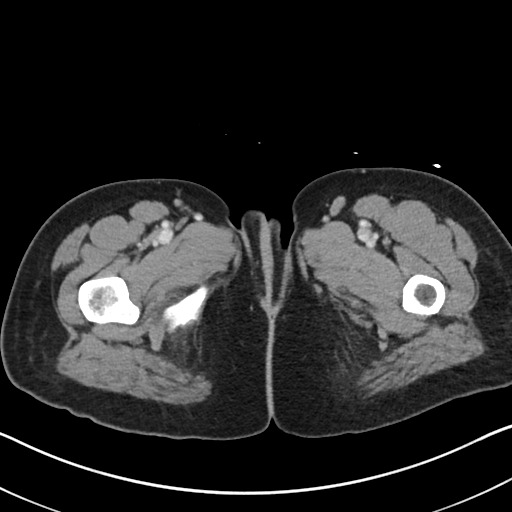
[im 5/84  bone]
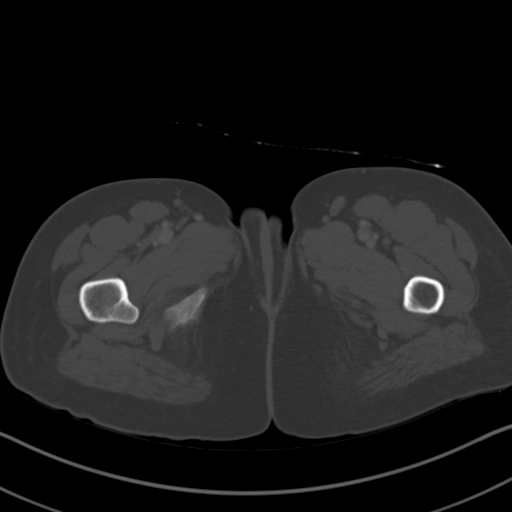
[im 15/84  soft-tissue]
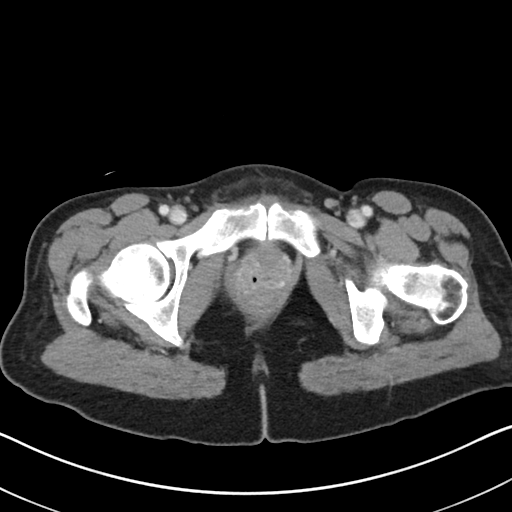
[im 20/84  soft-tissue]
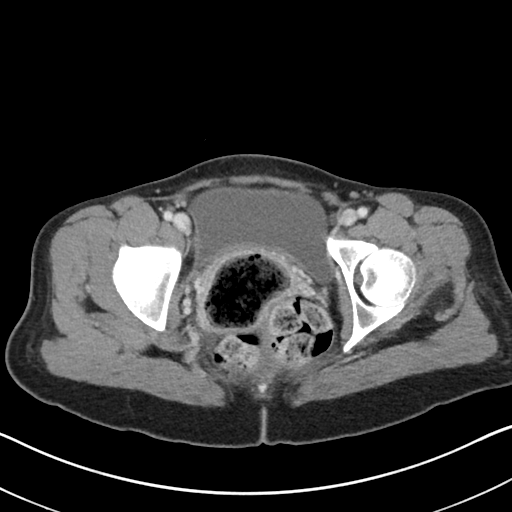
[im 30/84  soft-tissue]
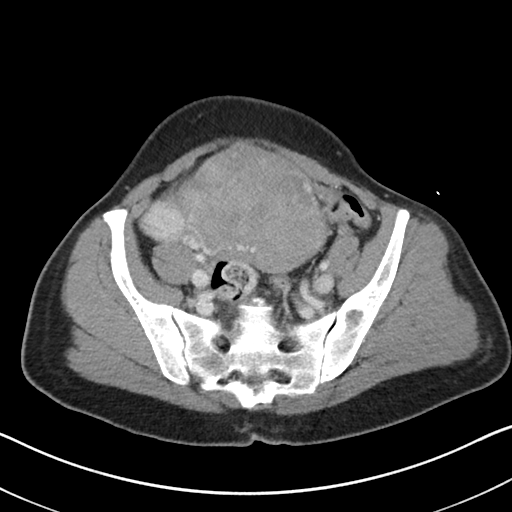
[im 35/84  soft-tissue]
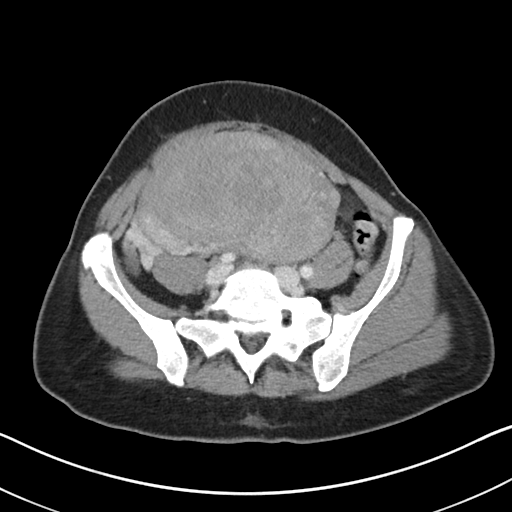
[im 44/84  soft-tissue]
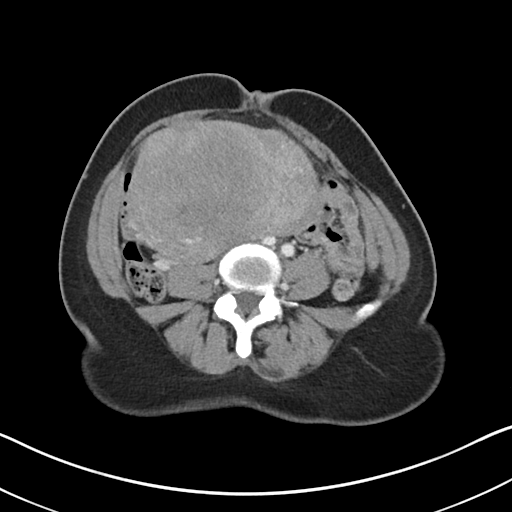
[im 49/84  soft-tissue]
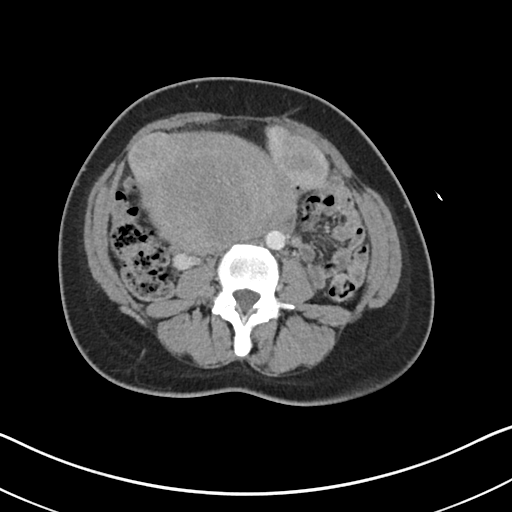
[im 54/84  soft-tissue]
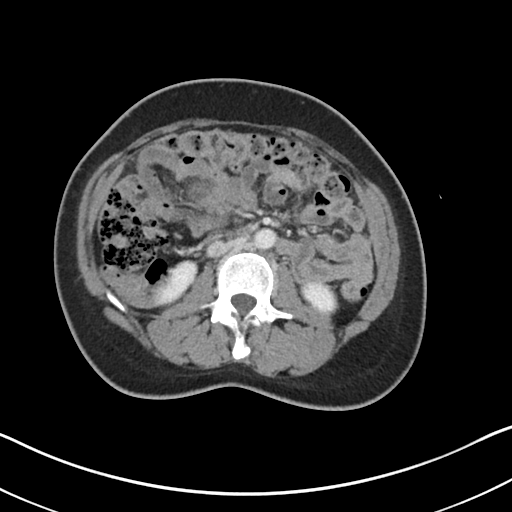
[im 64/84  soft-tissue]
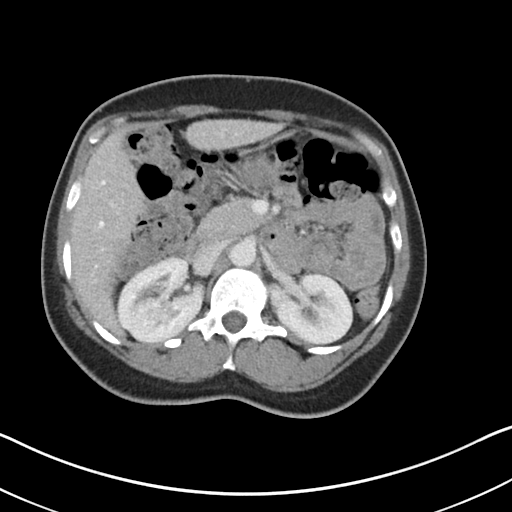
[im 64/84  bone]
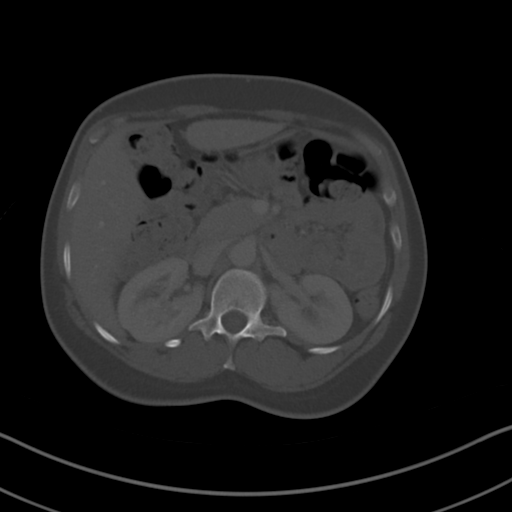
[im 69/84  soft-tissue]
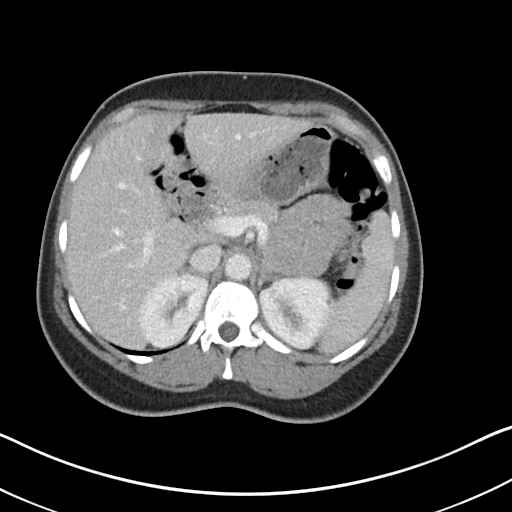
[im 79/84  soft-tissue]
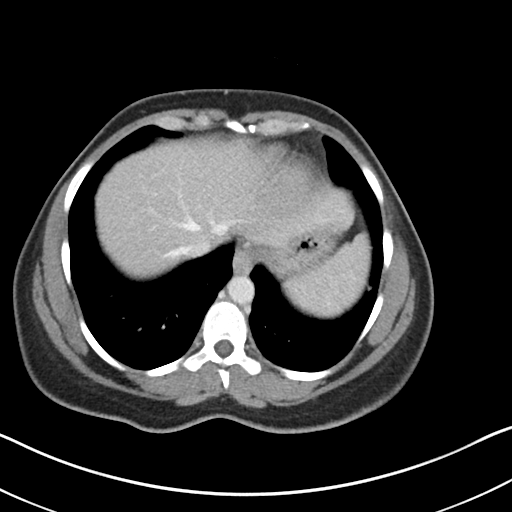

[Series 7: coronal soft tissue · coronal · 0.65mm/px · 3 of 85 slices shown]
[im 29/85  soft-tissue]
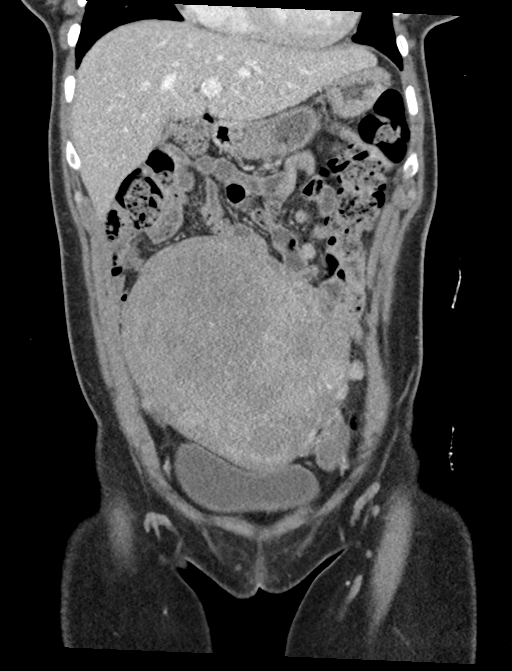
[im 38/85  soft-tissue]
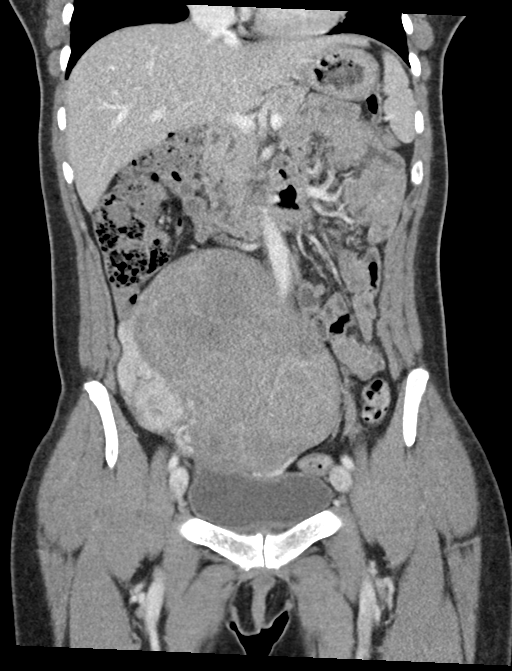
[im 47/85  soft-tissue]
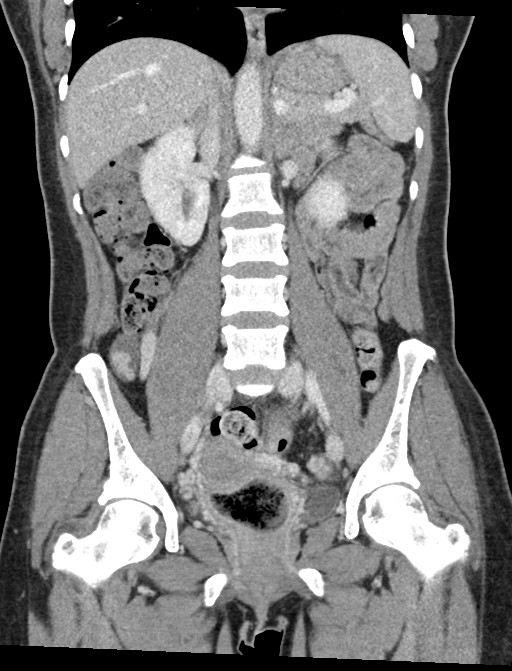

[14 of 46 positions shown; findings below may reference images not displayed]

FINDINGS: Lower chest: Lung bases are clear. Normal heart size. No pericardial
effusion.

Hepatobiliary: No focal liver abnormality is seen. Gallbladder is
largely decompressed. No visible gallstones, gallbladder wall
thickening, or biliary dilatation.

Pancreas: Unremarkable. No pancreatic ductal dilatation or
surrounding inflammatory changes.

Spleen: Normal in size without focal abnormality.

Adrenals/Urinary Tract: Normal adrenal glands. Fluid attenuation
cysts seen in the lower pole right kidney. No concerning renal
masses, no urolithiasis or hydronephrosis. No gross bladder wall
thickening, debris or calculi.

Stomach/Bowel: Distal esophagus, stomach and duodenum are
unremarkable. Much of the small bowel is displaced by the massively
enlarged uterus. No resulting obstruction. No small bowel dilatation
or wall thickening. A normal appendix is visualized. Proximal colon
is unremarkable. There is notable rectal wall thickening and loss of
discernible fat plane between the anterolateral wall of the rectum
and the vagina.

Vascular/Lymphatic: Normal caliber aorta. Normally opacifying
arterial structures. Hepatic and portal veins are normally
opacified. There is extensive engorgement of the bilateral
parametrial vessels, right greater than left.

Reproductive: Massive enlargement of the uterus with numerous
heterogeneously enhancing fibroids largest measuring up to 11.3 x
10.5 cm at the uterine fundus. Several more pedunculated subserosal
fibroids are noted as well with peripheral enhancement which could
reflect different stages of degeneration. Most focally enhancing
seen along the right lateral aspect of the uterus (coronal 7/35).
Difficult to discern the normal ovaries though suspect the right
ovary is a structure displaced just medial to the right iliac crest
and the left ovary remains a low pelvic position without separate
discernible adnexal lesions. There is a moderate volume of
low-attenuation almost feculent appearing material within the
vaginal vault. Hyperemic changes of the vagina are noted as well as
loss of discernible fat plane between the vagina and adjacent
rectum.

Other: Phlegmonous changes and fluid are noted in the deep pelvis.
No free air. No bowel containing hernias.

Musculoskeletal: No acute osseous abnormality or suspicious osseous
lesion.
IMPRESSION: Aerated material within the vaginal canal, could reflect feculent
debris versus retained tampon or other vaginal foreign body. Loss of
discernible fat plane with the adjacent thickened rectum with
surrounding hyperemic and inflammatory features in the low pelvis
could favor feculent material in the setting of a possible
rectovaginal fistula. Recommend correlation with direct pelvic
examination and evacuation of the vaginal canal. Following
decompression of both distal colon/rectum and vagina, luminal
contrast techniques could potentially be utilized to assess for
fistulization if there is continued clinical ambiguity.

Massive enlargement of the uterus with numerous fibroids both
intramural, submucosal and pedunculated subserosal. Several of which
are hyperenhancing suggesting active degeneration at this time. If
further evaluation is clinically warranted would consider MRI based
on the size of these lesions as sonography may be unable to fully
evaluate the extent.

Extensive parametrial venous engorgement, such findings could be
seen in the setting of pelvic congestion syndrome. Recommend
correlation with patient's symptomatology.

These results were called by telephone at the time of interpretation
on 01/10/2020 at [DATE] to provider ARZI KLM , who verbally
acknowledged these results.

## 2022-05-19 ENCOUNTER — Ambulatory Visit (HOSPITAL_COMMUNITY)
Admission: EM | Admit: 2022-05-19 | Discharge: 2022-05-19 | Disposition: A | Discharge status: Home / Self Care | Payer: BC Managed Care – PPO | Attending: Urgent Care | Admitting: Urgent Care

## 2022-05-19 ENCOUNTER — Encounter (HOSPITAL_COMMUNITY): Payer: Self-pay

## 2022-05-19 DIAGNOSIS — Z566 Other physical and mental strain related to work: Secondary | ICD-10-CM

## 2022-05-19 DIAGNOSIS — I10 Essential (primary) hypertension: Secondary | ICD-10-CM

## 2022-05-19 DIAGNOSIS — R600 Localized edema: Secondary | ICD-10-CM | POA: Diagnosis not present

## 2022-05-19 MED ORDER — HYDROCHLOROTHIAZIDE 25 MG PO TABS: 25.0000 mg | ORAL_TABLET | Freq: Every day | ORAL | 0 refills | Status: DC

## 2022-05-19 MED ORDER — BUSPIRONE HCL 5 MG PO TABS: 5.0000 mg | ORAL_TABLET | Freq: Two times a day (BID) | ORAL | 0 refills | Status: AC

## 2022-05-19 NOTE — ED Provider Notes (Signed)
[facility]    CSN: [id number] Arrival date & time: 05/19/22  1916      History   Chief Complaint Chief Complaint  Patient presents with   Joint Swelling    Left ankle    HPI Mary Rios is a 46 y.o. female.   Pleasant 46 year old female presents today with primary concern of leg swelling and left ankle pain.  A few weeks ago, she fell while at work and injured her right ankle.  She states that she has been favoring the right ankle, and now for 1 week her left ankle has been hurting.  She states she has been walking with a different gait than normal.  She denies any additional injury.  She states that her bilateral lower extremities are very swollen, likely from her 12 to 14-hour shifts in which she cannot sit down.  She states the left one feels tighter than the right 1.  She admits the swelling improves at nighttime.  She is concerned it is secondary to walking on concrete floors daily. Patient currently denies shortness of breath, chest pain, headache, blurred vision, dizziness, slurred speech.  She denies any symptoms of hypertension.  States it runs in her family.  Is not currently taking anything for this.  She feels that some of her elevated blood pressure superimposed due to her work stress.  She is a Health and safety inspector at [employer], and states the stress level is overwhelming. Pt does not smoke, is not on OCPs, denies hx of blood clots.      Past Medical History:  Diagnosis Date   Fibroids     Patient Active Problem List   Diagnosis Date Noted   Anemia 01/28/2020   Hypertension 01/28/2020    Past Surgical History:  Procedure Laterality Date   CESAREAN SECTION      OB History     Gravida  3   Para      Term      Preterm      AB  0   Living  3      SAB      IAB      Ectopic  0   Multiple      Live Births               Home Medications    Prior to Admission medications   Medication Sig Start Date End Date Taking?  Authorizing Provider  busPIRone (BUSPAR) 5 MG tablet Take 1 tablet (5 mg total) by mouth 2 (two) times daily. 05/19/22  Yes Rashawd Laskaris L, PA  hydrochlorothiazide (HYDRODIURIL) 25 MG tablet Take 1 tablet (25 mg total) by mouth daily. 05/19/22  Yes Timmi Devora L, PA  Multiple Vitamin (MULTIVITAMIN) capsule Take 1 capsule by mouth daily.    [provider]    Family History Family History  Problem Relation Age of Onset   Hypertension Mother    Diabetes Mother     Social History Social History   Tobacco Use   Smoking status: Never   Smokeless tobacco: Never  Substance Use Topics   Alcohol use: Not Currently   Drug use: No     Allergies   Patient has no known allergies.   Review of Systems Review of Systems As per HPI  Physical Exam Triage Vital Signs ED Triage Vitals  Enc Vitals Group     BP 05/19/22 1949 (!) 182/110     Pulse Rate 05/19/22 1949 (!) 108  Resp 05/19/22 1949 16     Temp 05/19/22 1949 98.5 F (36.9 C)     Temp Source 05/19/22 1949 Oral     SpO2 05/19/22 1949 96 %     Weight 05/19/22 1952 132 lb 15 oz (60.3 kg)     Height 05/19/22 1952 '4\' 11"'$  (1.499 m)     Head Circumference --      Peak Flow --      Pain Score 05/19/22 1952 0     Pain Loc --      Pain Edu? --      Excl. in [place]? --    No data found.  Updated Vital Signs BP (!) 182/110 (BP Location: Left Arm)   Pulse (!) 108   Temp 98.5 F (36.9 C) (Oral)   Resp 16   Ht '4\' 11"'$  (1.499 m)   Wt 132 lb 15 oz (60.3 kg)   LMP 05/18/2022 (Exact Date)   SpO2 96%   BMI 26.85 kg/m   Visual Acuity Right Eye Distance:   Left Eye Distance:   Bilateral Distance:    Right Eye Near:   Left Eye Near:    Bilateral Near:     Physical Exam Vitals and nursing note reviewed.  Constitutional:      General: She is not in acute distress.    Appearance: Normal appearance. She is not ill-appearing, toxic-appearing or diaphoretic.  HENT:     Head: Normocephalic and atraumatic.   Cardiovascular:     Rate and Rhythm: Regular rhythm. Tachycardia present.     Pulses: Normal pulses.     Heart sounds: Normal heart sounds. No murmur heard. Pulmonary:     Effort: Pulmonary effort is normal. No respiratory distress.     Breath sounds: Normal breath sounds. No stridor. No rhonchi or rales.  Chest:     Chest wall: No tenderness.  Musculoskeletal:        General: No tenderness, deformity or signs of injury. Normal range of motion.     Right lower leg: Edema present.     Left lower leg: Edema present.     Comments: Bilateral pitting edema; R calf = L calf B foot and ankle edema, equal bilaterally FROM ankle Negative talar tilt Negative anterior drawer No ecchymosis Negative homan sign   Skin:    General: Skin is warm and dry.     Capillary Refill: Capillary refill takes less than 2 seconds.     Findings: No erythema or rash.  Neurological:     General: No focal deficit present.     Mental Status: She is alert and oriented to person, place, and time.     Sensory: No sensory deficit.     Motor: No weakness.  Psychiatric:        Mood and Affect: Mood normal.      UC Treatments / Results  Labs (all labs ordered are listed, but only abnormal results are displayed) Labs Reviewed - No data to display  EKG   Radiology No results found.  Procedures Procedures (including critical care time)  Medications Ordered in UC Medications - No data to display  Initial Impression / Assessment and Plan / UC Course  I have reviewed the triage vital signs and the nursing notes.  Pertinent labs & imaging results that were available during my care of the patient were reviewed by me and considered in my medical decision making (see chart for details).  Clinical Course as of 05/19/22 2052  Thu  May 19, 2022  2027 Recheck vitals: pulse 102 BP 190/123 [WC]    Clinical Course User Index [WC] Adelayde Minney L, PA    B lower extremity edema -likely multifactorial,  discussed blood pressure, salt intake, compression hose, proper footwear, elevation of legs at night.  Suspect this is dependent edema as patient has no active cardiac or pulmonary symptoms.  It improves with elevation at night.  We will start patient on HCTZ to both help with swelling and blood pressure.  Hypertension -patient believes this is primarily due to her stress at work, although endorses a family history of hypertension.  Patient to purchase a blood pressure cuff and monitor at home, goal readings closer to 120/80.  Patient is to establish care with a PCP and follow-up in 2 to 3 weeks.  If blood pressure remains elevated, or if alarming symptoms develop, head straight to the emergency room. Stress at work -likely aggravating #2.  We will add buspirone to help with stress and blood pressure.  Follow-up with PCP for refills if it is effective.   Final Clinical Impressions(s) / UC Diagnoses   Final diagnoses:  Bilateral edema of lower extremity  Essential hypertension  Stress at work     Discharge Instructions      Please establish care with a primary care physician to further monitor your blood pressure. Start taking the blood pressure medication prescribed today daily in the morning. This should also help with your edema.  It will increase your frequency of urination. At night, elevate your feet higher than your heart to help with resolution of the fluid. First thing in the morning, please apply compression hose to your legs, they should be knee high.  Please purchase a blood pressure cuff, normal readings are 120/80. Please also start taking the buspirone twice daily.  This should help with your stress, which naturally will help with your blood pressure.  If your blood pressure remains elevated, if you develop dizziness, blurred vision, headache, chest pain, please head to straight to the emergency room.   ED Prescriptions     Medication Sig Dispense Auth. Provider    busPIRone (BUSPAR) 5 MG tablet Take 1 tablet (5 mg total) by mouth 2 (two) times daily. 60 tablet Nyana Haren L, PA   hydrochlorothiazide (HYDRODIURIL) 25 MG tablet Take 1 tablet (25 mg total) by mouth daily. 30 tablet Stormy Connon L, [place]      PDMP not reviewed this encounter.   Chaney Malling, [place] 05/19/22 2106

## 2022-05-19 NOTE — ED Triage Notes (Signed)
Patient having swelling in the left ankle. Sprained the right ankle 2  months ago and has been carrying most of her weight with her left leg.   Onset of left ankle swelling last week. Patient states the ankle feel tight but not painful. No history of blood clots or arthritis.

## 2022-05-19 NOTE — Discharge Instructions (Addendum)
Please establish care with a primary care physician to further monitor your blood pressure. Start taking the blood pressure medication prescribed today daily in the morning. This should also help with your edema.  It will increase your frequency of urination. At night, elevate your feet higher than your heart to help with resolution of the fluid. First thing in the morning, please apply compression hose to your legs, they should be knee high.  Please purchase a blood pressure cuff, normal readings are 120/80. Please also start taking the buspirone twice daily.  This should help with your stress, which naturally will help with your blood pressure.  If your blood pressure remains elevated, if you develop dizziness, blurred vision, headache, chest pain, please head to straight to the emergency room.

## 2022-06-28 ENCOUNTER — Encounter (HOSPITAL_COMMUNITY): Payer: Self-pay | Admitting: Emergency Medicine

## 2022-06-28 ENCOUNTER — Ambulatory Visit (HOSPITAL_COMMUNITY)
Admission: EM | Admit: 2022-06-28 | Discharge: 2022-06-28 | Disposition: A | Discharge status: Home / Self Care | Payer: BC Managed Care – PPO | Attending: Family Medicine | Admitting: Family Medicine

## 2022-06-28 ENCOUNTER — Other Ambulatory Visit: Payer: Self-pay

## 2022-06-28 DIAGNOSIS — R600 Localized edema: Secondary | ICD-10-CM | POA: Diagnosis not present

## 2022-06-28 HISTORY — DX: Essential (primary) hypertension: I10

## 2022-06-28 LAB — BRAIN NATRIURETIC PEPTIDE: B Natriuretic Peptide: 776.2 pg/mL — ABNORMAL HIGH (ref 0.0–100.0)

## 2022-06-28 LAB — CBC
HCT: 28.6 % — ABNORMAL LOW (ref 36.0–46.0)
Hemoglobin: 8.1 g/dL — ABNORMAL LOW (ref 12.0–15.0)
MCH: 15.1 pg — ABNORMAL LOW (ref 26.0–34.0)
MCHC: 28.3 g/dL — ABNORMAL LOW (ref 30.0–36.0)
MCV: 53.2 fL — ABNORMAL LOW (ref 80.0–100.0)
Platelets: 355 10*3/uL (ref 150–400)
RBC: 5.38 MIL/uL — ABNORMAL HIGH (ref 3.87–5.11)
RDW: 23.1 % — ABNORMAL HIGH (ref 11.5–15.5)
WBC: 5.5 10*3/uL (ref 4.0–10.5)
nRBC: 0.5 % — ABNORMAL HIGH (ref 0.0–0.2)

## 2022-06-28 MED ORDER — FUROSEMIDE 20 MG PO TABS: ORAL_TABLET | ORAL | 0 refills | Status: DC

## 2022-06-28 NOTE — Discharge Instructions (Addendum)
Swelling happens when fluid collects in small spaces around tissues and organs inside the body. Another word for swelling is "edema." Some common parts of the body where people can have swelling are the lower legs or hands. This typically is worse in the areas of the body that are closest to the ground (because of gravity)  Symptoms of swelling can include puffiness of the skin, which can cause the skin to look stretched and shiny. This often occurs with swelling in the lower legs and can be worse after you sit or stand for a long time.  Treatment of edema includes several components: treatment of the underlying cause (if possible), reducing the amount of salt (sodium) in your diet, and, in many cases, use of a medication called a diuretic to eliminate excess fluid. Using compression stockings and elevating the legs may also be recommended.   You have had labs (blood work) sent this evening. We will call you with any significant abnormalities or if there is need to begin or change treatment or pursue further follow up.  You may also review your test results online through [place]. If you do not have a MyChart account, instructions to sign up should be on your discharge paperwork.  Stop taking your HCTZ while taking the prescribed Lasix.

## 2022-06-28 NOTE — ED Triage Notes (Signed)
4 days ago started having swelling in both feet.  Now swelling is including bilateral lower legs.  And when propping legs up, gets sob. Denies chest pain  Patient seen in July 2023.  Patient has a new doctor, but appt is not until August 30.

## 2022-06-29 NOTE — ED Provider Notes (Signed)
Reading   [id number] 06/28/22 Arrival Time: [place]:  1. Bilateral lower extremity edema    On/off for quite awhile per pt. Concern for CHF. No SOB/CP.  Labs Reviewed  CBC - Abnormal; Notable for the following components:      Result Value   RBC 5.38 (*)    Hemoglobin 8.1 (*)    HCT 28.6 (*)    MCV 53.2 (*)    MCH 15.1 (*)    MCHC 28.3 (*)    RDW 23.1 (*)    nRBC 0.5 (*)    All other components within normal limits  BRAIN NATRIURETIC PEPTIDE - Abnormal; Notable for the following components:   B Natriuretic Peptide 776.2 (*)    All other components within normal limits   Notified by RN BNP elevated. Recommend PCP or cardiology f/u. Known anemia. Stable. For some reason CMP ordered was discontinued.  Begin: Meds ordered this encounter  Medications   furosemide (LASIX) 20 MG tablet    Sig: Take 1-2 tablets every morning as needed for leg swelling.    Dispense:  30 tablet    Refill:  0    Reviewed expectations re: course of current medical issues. Questions answered. Outlined signs and symptoms indicating need for more acute intervention. Patient verbalized understanding. After Visit Summary given.   SUBJECTIVE:  History from: patient. Mary Rios is a 46 y.o. female who presents with complaint of bilateral LE swelling; few months; on/off. Worse in evening; better in morning. No CP/SOB. No recent immobility. Is painful at times. Does not weigh herself frequently. No orthopnea or PND.  Social History   Tobacco Use  Smoking Status Never  Smokeless Tobacco Never   Social History   Substance and Sexual Activity  Alcohol Use Yes   Comment: rare    OBJECTIVE:  Vitals:   06/28/22 1938  BP: (!) 154/99  Pulse: (!) 106  Resp: 20  Temp: 98.7 F (37.1 C)  TempSrc: Oral  SpO2: 98%    Receck P: 92 General appearance: alert, oriented, no acute distress Eyes: PERRLA; EOMI; conjunctivae normal HENT: normocephalic;  atraumatic Neck: supple with FROM Lungs: without labored respirations; speaks full sentences without difficulty; CTAB Heart: regular rate and rhythm Abdomen: soft, obese Extremities: with 2+ pitting edema bilaterally Skin: warm and dry; without rash or lesions Neuro: normal gait Psychological: alert and cooperative; normal mood and affect  Labs: Results for orders placed or performed during the hospital encounter of 06/28/22  CBC  Result Value Ref Range   WBC 5.5 4.0 - 10.5 K/uL   RBC 5.38 (H) 3.87 - 5.11 MIL/uL   Hemoglobin 8.1 (L) 12.0 - 15.0 g/dL   HCT 28.6 (L) 36.0 - 46.0 %   MCV 53.2 (L) 80.0 - 100.0 fL   MCH 15.1 (L) 26.0 - 34.0 pg   MCHC 28.3 (L) 30.0 - 36.0 g/dL   RDW 23.1 (H) 11.5 - 15.5 %   Platelets 355 150 - 400 K/uL   nRBC 0.5 (H) 0.0 - 0.2 %  Brain natriuretic peptide  Result Value Ref Range   B Natriuretic Peptide 776.2 (H) 0.0 - 100.0 pg/mL   Labs Reviewed  CBC - Abnormal; Notable for the following components:      Result Value   RBC 5.38 (*)    Hemoglobin 8.1 (*)    HCT 28.6 (*)    MCV 53.2 (*)    MCH 15.1 (*)    MCHC 28.3 (*)  RDW 23.1 (*)    nRBC 0.5 (*)    All other components within normal limits  BRAIN NATRIURETIC PEPTIDE - Abnormal; Notable for the following components:   B Natriuretic Peptide 776.2 (*)    All other components within normal limits    No Known Allergies  Past Medical History:  Diagnosis Date   Fibroids    Hypertension    Social History   Socioeconomic History   Marital status: Single    Spouse name: Not on file   Number of children: Not on file   Years of education: Not on file   Highest education level: Not on file  Occupational History   Not on file  Tobacco Use   Smoking status: Never   Smokeless tobacco: Never  Vaping Use   Vaping Use: Never used  Substance and Sexual Activity   Alcohol use: Yes    Comment: rare   Drug use: No   Sexual activity: Yes    Birth control/protection: Condom  Other Topics  Concern   Not on file  Social History Narrative   Not on file   Social Determinants of Health   Financial Resource Strain: Not on file  Food Insecurity: Not on file  Transportation Needs: Not on file  Physical Activity: Not on file  Stress: Not on file  Social Connections: Not on file  Intimate Partner Violence: Not on file   Family History  Problem Relation Age of Onset   Hypertension Mother    Diabetes Mother    Past Surgical History:  Procedure Laterality Date   CESAREAN SECTION        Vanessa Kick, MD 06/29/22 [phone]

## 2022-08-04 NOTE — Progress Notes (Unsigned)
Cardiology Office Note:    Date:  08/04/2022   ID:  Mary Rios, DOB [birth year], MRN [id number]  PCP:  Huel Cote, NP (Inactive)   [facility] Providers Cardiologist:  None {   Referring MD: Vonna Drafts, FNP    History of Present Illness:    Mary Rios is a 46 y.o. female with a hx of HTN who was referred by Selina Cooley, FNP for further evaluation of LE edema.  Patient presented to [facility] on 06/28/22 for LE edema. BNP 776.2. She was discharged on lasix 20-'40mg'$  daily for LE edema and recommended for Cardiology follow-up.  Today, ***  Past Medical History:  Diagnosis Date   Edema of both lower extremities    Fibroids    Hypertension    Hypertensive urgency     Past Surgical History:  Procedure Laterality Date   CESAREAN SECTION      Current Medications: No outpatient medications have been marked as taking for the 08/10/22 encounter (Appointment) with Freada Bergeron, MD.     Allergies:   Patient has no known allergies.   Social History   Socioeconomic History   Marital status: Single    Spouse name: Not on file   Number of children: Not on file   Years of education: Not on file   Highest education level: Not on file  Occupational History   Not on file  Tobacco Use   Smoking status: Never   Smokeless tobacco: Never  Vaping Use   Vaping Use: Never used  Substance and Sexual Activity   Alcohol use: Yes    Comment: rare   Drug use: No   Sexual activity: Yes    Birth control/protection: Condom  Other Topics Concern   Not on file  Social History Narrative   Not on file   Social Determinants of Health   Financial Resource Strain: Not on file  Food Insecurity: Not on file  Transportation Needs: Not on file  Physical Activity: Not on file  Stress: Not on file  Social Connections: Not on file     Family History: The patient's ***family history includes Diabetes in her mother; Hypertension in her  mother.  ROS:   Please see the history of present illness.    *** All other systems reviewed and are negative.  EKGs/Labs/Other Studies Reviewed:    The following studies were reviewed today: ***  EKG:  EKG is *** ordered today.  The ekg ordered today demonstrates ***  Recent Labs: 06/28/2022: B Natriuretic Peptide 776.2; Hemoglobin 8.1; Platelets 355  Recent Lipid Panel No results found for: "CHOL", "TRIG", "HDL", "CHOLHDL", "VLDL", "LDLCALC", "LDLDIRECT"   Risk Assessment/Calculations:   {Does this patient have ATRIAL FIBRILLATION?:[phone]}  No BP recorded.  {Refresh Note OR Click here to enter BP  :1}***         Physical Exam:    VS:  There were no vitals taken for this visit.    Wt Readings from Last 3 Encounters:  05/19/22 132 lb 15 oz (60.3 kg)  01/28/20 133 lb (60.3 kg)  01/28/20 127 lb (57.6 kg)     GEN: *** Well nourished, well developed in no acute distress HEENT: Normal NECK: No JVD; No carotid bruits LYMPHATICS: No lymphadenopathy CARDIAC: ***RRR, no murmurs, rubs, gallops RESPIRATORY:  Clear to auscultation without rales, wheezing or rhonchi  ABDOMEN: Soft, non-tender, non-distended MUSCULOSKELETAL:  No edema; No deformity  SKIN: Warm and dry NEUROLOGIC:  Alert and oriented x  3 PSYCHIATRIC:  Normal affect   ASSESSMENT:    No diagnosis found. PLAN:    In order of problems listed above:  #Heart Failure: #LE Edema: Patient with recent UC visit for worsening LE edema. BNP at that time 776. Was started on lasix and recommended for CV follow-up. Today, *** -Check TTE -Continue lasix *** -Change HCTZ to losartan ***      {Are you ordering a CV Procedure (e.g. stress test, cath, DCCV, TEE, etc)?   Press F2        :[id number]}    Medication Adjustments/Labs and Tests Ordered: Current medicines are reviewed at length with the patient today.  Concerns regarding medicines are outlined above.  No orders of the defined types were placed in this  encounter.  No orders of the defined types were placed in this encounter.   There are no Patient Instructions on file for this visit.   Signed, Freada Bergeron, MD  08/04/2022 1:55 PM    [place]

## 2022-08-10 ENCOUNTER — Ambulatory Visit: Payer: Managed Care, Other (non HMO) | Attending: Cardiology | Admitting: Cardiology

## 2022-08-10 ENCOUNTER — Encounter: Payer: Self-pay | Admitting: Cardiology

## 2022-08-10 ENCOUNTER — Ambulatory Visit (HOSPITAL_BASED_OUTPATIENT_CLINIC_OR_DEPARTMENT_OTHER): Payer: Managed Care, Other (non HMO)

## 2022-08-10 VITALS — BP 150/110 | HR 103 | Ht 59.0 in | Wt 144.8 lb

## 2022-08-10 DIAGNOSIS — I509 Heart failure, unspecified: Secondary | ICD-10-CM | POA: Diagnosis present

## 2022-08-10 DIAGNOSIS — Z79899 Other long term (current) drug therapy: Secondary | ICD-10-CM

## 2022-08-10 DIAGNOSIS — I088 Other rheumatic multiple valve diseases: Secondary | ICD-10-CM | POA: Diagnosis not present

## 2022-08-10 DIAGNOSIS — I110 Hypertensive heart disease with heart failure: Secondary | ICD-10-CM

## 2022-08-10 DIAGNOSIS — I5030 Unspecified diastolic (congestive) heart failure: Secondary | ICD-10-CM | POA: Diagnosis not present

## 2022-08-10 DIAGNOSIS — I517 Cardiomegaly: Secondary | ICD-10-CM | POA: Diagnosis not present

## 2022-08-10 DIAGNOSIS — I3139 Other pericardial effusion (noninflammatory): Secondary | ICD-10-CM | POA: Diagnosis not present

## 2022-08-10 MED ORDER — POTASSIUM CHLORIDE CRYS ER 20 MEQ PO TBCR: EXTENDED_RELEASE_TABLET | ORAL | 0 refills | Status: DC

## 2022-08-10 MED ORDER — VALSARTAN 160 MG PO TABS: 160.0000 mg | ORAL_TABLET | Freq: Every day | ORAL | 1 refills | Status: DC

## 2022-08-10 MED ORDER — FUROSEMIDE 40 MG PO TABS: ORAL_TABLET | ORAL | 0 refills | Status: DC

## 2022-08-10 MED ORDER — SPIRONOLACTONE 25 MG PO TABS: 12.5000 mg | ORAL_TABLET | Freq: Every day | ORAL | 3 refills | Status: DC

## 2022-08-10 NOTE — Progress Notes (Signed)
Cardiology Office Note:    Date:  08/10/2022   ID:  Servando Snare, DOB [birth year], MRN [id number]  PCP:  Vonna Drafts, [place] Providers Cardiologist:  None {   Referring MD: Vonna Drafts, FNP    History of Present Illness:    Mary Rios is a 46 y.o. female with a hx of HTN who was referred by Selina Cooley, FNP for further evaluation of LE edema.  Patient presented to [facility] on 06/28/22 for LE edema. BNP 776.2. She was discharged on lasix 20-'40mg'$  daily for LE edema and recommended for Cardiology follow-up.  Today, the patient states that she continues to experience significant DOE and LE edema. Specifically, she was unable to walk into the building without needing to stop to rest. No chest pain. Has 2 pillow orthopnea that has been ongoing for the past 2 months.   She confirms that she was prescribed antihypertensives along with her lasix when she was discharged from Urgent Care 06/2022. Prior to starting the Lasix, her swelling was more severe than it is today. At this time she is taking 20 mg Lasix once daily and HCTZ '25mg'$  daily  Occasionally she feels a flutter in her chest; these episodes began a couple weeks ago. This mostly occurs at work, and it prompts her to sit down and rest until her palpitations subside. Also there are days where she feels dizzy, usually after standing for a long time at work. No syncope.   Of note, she has recently changed jobs due to significant stress from her previous job. She believes her symptoms correlate with her stress at work.  She denies any chest pain, headaches, syncope, or PND.  In her family, she states that at least 2 out of 4 uncles have heart failure. Her grandmother also has heart issues. She denies any recent viral illness, episode of significantly chest pain, sustained palpitations.  Notably, she is grossly overloaded on exam with elevated JVD consistent with significant HF symptoms.  Discussed this with her at length. Will need urgent echo and medication titration with frequent follow-up.   Past Medical History:  Diagnosis Date   Edema of both lower extremities    Fibroids    Hypertension    Hypertensive urgency     Past Surgical History:  Procedure Laterality Date   CESAREAN SECTION      Current Medications: Current Meds  Medication Sig   busPIRone (BUSPAR) 5 MG tablet Take 1 tablet (5 mg total) by mouth 2 (two) times daily.   furosemide (LASIX) 40 MG tablet Take 1 tablet (40 mg total) by mouth twice daily for 5 days only, then decrease to taking 1 tablet (40 mg total) by mouth daily thereafter.   Multiple Vitamin (MULTIVITAMIN) capsule Take 1 capsule by mouth daily.   potassium chloride SA (KLOR-CON M) 20 MEQ tablet Take 1 tablet (20 mEq total) by mouth twice daily for 5 days only, then decrease to taking 1 tablet (20 mEq total) by mouth daily thereafter.   spironolactone (ALDACTONE) 25 MG tablet Take 0.5 tablets (12.5 mg total) by mouth daily.   valsartan (DIOVAN) 160 MG tablet Take 1 tablet (160 mg total) by mouth daily.   [DISCONTINUED] furosemide (LASIX) 20 MG tablet Take 1-2 tablets every morning as needed for leg swelling.   [DISCONTINUED] hydrochlorothiazide (HYDRODIURIL) 25 MG tablet Take 1 tablet (25 mg total) by mouth daily.     Allergies:   Patient has no known  allergies.   Social History   Socioeconomic History   Marital status: Single    Spouse name: Not on file   Number of children: Not on file   Years of education: Not on file   Highest education level: Not on file  Occupational History   Not on file  Tobacco Use   Smoking status: Never   Smokeless tobacco: Never  Vaping Use   Vaping Use: Never used  Substance and Sexual Activity   Alcohol use: Yes    Comment: rare   Drug use: No   Sexual activity: Yes    Birth control/protection: Condom  Other Topics Concern   Not on file  Social History Narrative   Not on file   Social  Determinants of Health   Financial Resource Strain: Not on file  Food Insecurity: Not on file  Transportation Needs: Not on file  Physical Activity: Not on file  Stress: Not on file  Social Connections: Not on file     Family History: The patient's family history includes Diabetes in her mother; Hypertension in her mother.  ROS:   Review of Systems  Constitutional:  Negative for chills and fever.  HENT:  Negative for nosebleeds and tinnitus.   Eyes:  Negative for blurred vision and pain.  Respiratory:  Positive for shortness of breath. Negative for cough, hemoptysis and stridor.   Cardiovascular:  Positive for palpitations, orthopnea and leg swelling. Negative for chest pain, claudication and PND.  Gastrointestinal:  Negative for blood in stool, diarrhea, nausea and vomiting.  Genitourinary:  Negative for dysuria and hematuria.  Musculoskeletal:  Negative for falls.  Neurological:  Positive for dizziness. Negative for loss of consciousness and headaches.  Psychiatric/Behavioral:  Negative for depression, hallucinations and substance abuse. The patient does not have insomnia.      EKGs/Labs/Other Studies Reviewed:    The following studies were reviewed today: No prior cardiovascular studies available.  EKG:  EKG is personally reviewed. 08/10/2022:  Sinus tachycardia. Rate 103 bpm.   Recent Labs: 06/28/2022: B Natriuretic Peptide 776.2; Hemoglobin 8.1; Platelets 355   Recent Lipid Panel No results found for: "CHOL", "TRIG", "HDL", "CHOLHDL", "VLDL", "LDLCALC", "LDLDIRECT"   Risk Assessment/Calculations:      HYPERTENSION CONTROL Vitals:   08/10/22 0835 08/10/22 0858  BP: (!) 148/114 (!) 150/110    The patient's blood pressure is elevated above target today.  In order to address the patient's elevated BP: A current anti-hypertensive medication was adjusted today.            Physical Exam:    VS:  BP (!) 150/110 (BP Location: Left Arm, Patient Position:  Sitting, Cuff Size: Normal)   Pulse (!) 103   Ht '4\' 11"'$  (1.499 m)   Wt 144 lb 12.8 oz (65.7 kg)   SpO2 99%   BMI 29.25 kg/m     Wt Readings from Last 3 Encounters:  08/10/22 144 lb 12.8 oz (65.7 kg)  05/19/22 132 lb 15 oz (60.3 kg)  01/28/20 133 lb (60.3 kg)     GEN: Well nourished, well developed in no acute distress HEENT: Normal NECK: + JVD up to angle of jaw; No carotid bruits CARDIAC: Tachycardic, regular, no murmurs RESPIRATORY:  Bibasilar crackles ABDOMEN: Soft, non-tender, non-distended MUSCULOSKELETAL:  2+ tense pitting edema to the thighs SKIN: Warm and dry NEUROLOGIC:  Alert and oriented x 3 PSYCHIATRIC:  Normal affect   ASSESSMENT:    1. Acute heart failure, unspecified heart failure type ([place])   2.  Medication management   3. Hypertensive heart disease with heart failure (HCC)    PLAN:    In order of problems listed above:  #Heart Failure: #LE Edema: Patient with recent UC visit for worsening LE edema. BNP at that time 776. Was started on HCTZ and lasix and referred to Cardiology. Currently, she remains grossly overloaded on exam with elevated JVD, tense pitting edema, orthopnea and DOE. EF unknown. Will check urgent TTE and start GDMT as able to HF. Will transition her off HCTZ and change to valsartan. Will increase lasix and start spiro as well. May merit ischemic work-up pending EF. -Check urgent TTE -Check BNP, BMET, Mg today -Increase lasix to '40mg'$  BID x 5days and then '40mg'$  daily thereafter -Start K+ 64mq BID x5 days and then 240m daily therafter -Change HCTZ to valsartan '160mg'$  daily; will look into coverage for entresto -Start spiro 12.'5mg'$  daily -Hold BB for now given significant volume overload; will add as able -Check TTE -Check BMET next week following diuresis -Monitor daily weights -May merit ischemic work-up pending EF -Needs close follow-up in 2 weeks   #Poorly Controlled HTN: Possibly driving underlying HF. Will change HCTZ to valsartan  as above, diurese as above and add spiro. She will keep BP log for [place]. -Change HCTZ to valsartan '160mg'$  daily -Diuresis as above -Start spiro 12.'5mg'$  daily -BMET today and next week -Keep BP log for [place] review -Needs close follow-up in 2 weeks     Follow-up:  2 weeks with APP.  Medication Adjustments/Labs and Tests Ordered: Current medicines are reviewed at length with the patient today.  Concerns regarding medicines are outlined above.   Orders Placed This Encounter  Procedures   Basic metabolic panel   Basic metabolic panel   Pro b natriuretic peptide   EKG 12-Lead   ECHOCARDIOGRAM COMPLETE   Meds ordered this encounter  Medications   valsartan (DIOVAN) 160 MG tablet    Sig: Take 1 tablet (160 mg total) by mouth daily.    Dispense:  90 tablet    Refill:  1   furosemide (LASIX) 40 MG tablet    Sig: Take 1 tablet (40 mg total) by mouth twice daily for 5 days only, then decrease to taking 1 tablet (40 mg total) by mouth daily thereafter.    Dispense:  42 tablet    Refill:  0   potassium chloride SA (KLOR-CON M) 20 MEQ tablet    Sig: Take 1 tablet (20 mEq total) by mouth twice daily for 5 days only, then decrease to taking 1 tablet (20 mEq total) by mouth daily thereafter.    Dispense:  42 tablet    Refill:  0    Take in concurrence with lasix.   spironolactone (ALDACTONE) 25 MG tablet    Sig: Take 0.5 tablets (12.5 mg total) by mouth daily.    Dispense:  45 tablet    Refill:  3   Patient Instructions  Medication Instructions:   STOP TAKING HYDROCHLOROTHIAZIDE NOW  START TAKING VALSARTAN 160 MG BY MOUTH DAILY  START TAKING LASIX 40 MG BY MOUTH TWICE DAILY FOR 5 DAYS ONLY, THEN DECREASE TO TAKING 40 MG BY MOUTH ONCE DAILY THEREAFTER--TAKE IN CONCURRENCE WITH YOUR POTASSIUM PILL  START TAKING POTASSIUM CHLORIDE 20 mEq BY MOUTH TWICE DAILY FOR 5 DAYS ONLY, THEN DECREASE TO TAKING 20 mEq BY MOUTH DAILY THEREAFTER--TAKE IN CONCURRENCE WITH YOUR LASIX  START  TAKING SPIRONOLACTONE 12.5 MG BY MOUTH DAILY  *If you need  a refill on your cardiac medications before your next appointment, please call your pharmacy*   Lab Work:  1.) TODAY--BMET AND PRO-BNP  2.) IN ONE WEEK HERE IN THE OFFICE--BMET  If you have labs (blood work) drawn today and your tests are completely normal, you will receive your results only by: [place] (if you have MyChart) OR A paper copy in the mail If you have any lab test that is abnormal or we need to change your treatment, we will call you to review the results.   Testing/Procedures:  Your physician has requested that you have an echocardiogram. Echocardiography is a painless test that uses sound waves to create images of your heart. It provides your doctor with information about the size and shape of your heart and how well your heart's chambers and valves are working. This procedure takes approximately one hour. There are no restrictions for this procedure. THIS NEEDS TO BE SCHEDULED URGENTLY PER DR. Johney Frame    Follow-Up:  2 WEEKS WITH AN EXTENDER IN THE OFFICE         I,Mathew Stumpf,acting as a scribe for Freada Bergeron, MD.,have documented all relevant documentation on the behalf of Freada Bergeron, MD,as directed by  Freada Bergeron, MD while in the presence of Freada Bergeron, MD.  I, Freada Bergeron, MD, have reviewed all documentation for this visit. The documentation on 08/10/22 for the exam, diagnosis, procedures, and orders are all accurate and complete.   Signed, Freada Bergeron, MD  08/10/2022 9:25 AM    Mary Rios

## 2022-08-10 NOTE — Patient Instructions (Signed)
Medication Instructions:   STOP TAKING HYDROCHLOROTHIAZIDE NOW  START TAKING VALSARTAN 160 MG BY MOUTH DAILY  START TAKING LASIX 40 MG BY MOUTH TWICE DAILY FOR 5 DAYS ONLY, THEN DECREASE TO TAKING 40 MG BY MOUTH ONCE DAILY THEREAFTER--TAKE IN CONCURRENCE WITH YOUR POTASSIUM PILL  START TAKING POTASSIUM CHLORIDE 20 mEq BY MOUTH TWICE DAILY FOR 5 DAYS ONLY, THEN DECREASE TO TAKING 20 mEq BY MOUTH DAILY THEREAFTER--TAKE IN CONCURRENCE WITH YOUR LASIX  START TAKING SPIRONOLACTONE 12.5 MG BY MOUTH DAILY  *If you need a refill on your cardiac medications before your next appointment, please call your pharmacy*   Lab Work:  1.) TODAY--BMET AND PRO-BNP  2.) IN ONE WEEK HERE IN THE OFFICE--BMET  If you have labs (blood work) drawn today and your tests are completely normal, you will receive your results only by: MyChart Message (if you have MyChart) OR A paper copy in the mail If you have any lab test that is abnormal or we need to change your treatment, we will call you to review the results.   Testing/Procedures:  Your physician has requested that you have an echocardiogram. Echocardiography is a painless test that uses sound waves to create images of your heart. It provides your doctor with information about the size and shape of your heart and how well your heart's chambers and valves are working. This procedure takes approximately one hour. There are no restrictions for this procedure. THIS NEEDS TO BE SCHEDULED URGENTLY PER DR. Johney Frame    Follow-Up:  2 WEEKS WITH AN EXTENDER IN THE OFFICE

## 2022-08-11 LAB — BASIC METABOLIC PANEL
BUN/Creatinine Ratio: 12 (ref 9–23)
BUN: 9 mg/dL (ref 6–24)
CO2: 22 mmol/L (ref 20–29)
Calcium: 9.5 mg/dL (ref 8.7–10.2)
Chloride: 102 mmol/L (ref 96–106)
Creatinine, Ser: 0.78 mg/dL (ref 0.57–1.00)
Glucose: 95 mg/dL (ref 70–99)
Potassium: 4.3 mmol/L (ref 3.5–5.2)
Sodium: 137 mmol/L (ref 134–144)
eGFR: 95 mL/min/{1.73_m2} (ref >59)

## 2022-08-11 LAB — ECHOCARDIOGRAM COMPLETE
Area-P 1/2: 7.29 cm2
Height: 59 in
S' Lateral: 4.7 cm
Weight: 2316.8 oz

## 2022-08-11 LAB — PRO B NATRIURETIC PEPTIDE: NT-Pro BNP: 1151 pg/mL — ABNORMAL HIGH (ref 0–249)

## 2022-08-12 ENCOUNTER — Telehealth: Payer: Self-pay | Admitting: Cardiology

## 2022-08-12 ENCOUNTER — Telehealth: Payer: Self-pay

## 2022-08-12 DIAGNOSIS — I509 Heart failure, unspecified: Secondary | ICD-10-CM

## 2022-08-12 NOTE — Telephone Encounter (Signed)
Referral has been placed for heart failure clinic

## 2022-08-12 NOTE — Telephone Encounter (Signed)
Duplicate encounter-already referred.

## 2022-08-12 NOTE — Addendum Note (Signed)
Addended by: Antonieta Iba on: 08/12/2022 10:54 AM   Modules accepted: Orders

## 2022-08-12 NOTE — Telephone Encounter (Signed)
Attempted to call both the patient and her sister to go over echo results. Both phones had busy signals. Will need to continue to call to review her echo which shows a severely reduced LVEF, severe TR, moderate RV dysfunction. She is supposed to see [place] back in 2 weeks for med titration. She needs referral to HF team as well as will need ischemic work-up and eventual RHC and possibly CMR pending ischemic work-up. Will continue to try to reach her.  Gwyndolyn Kaufman, MD

## 2022-08-12 NOTE — Telephone Encounter (Signed)
-----   Message from Freada Bergeron, MD sent at 08/12/2022 10:44 AM EDT ----- Called and spoke to the patient about her labs and echo. Can we refer to heart failure clinic ASAP? I will look into entresto coverage for her.

## 2022-08-12 NOTE — Telephone Encounter (Signed)
Please see prior phone note. Called and spoke to her about her echo results and labs.   Will need: -Heart failure referral -She will continue the lasix '40mg'$  PO BID for 7 days (she is responding well) and then daily thereafter with potassium supplementation. She will send a message next week about how her swelling is so we can adjust if needed -Will reach out to Pharm D about coverage for entresto -She will continue spiro 12.'5mg'$  daily -Will need farxiga at follow-up visit with Ambrose Pancoast -She is due for repeat labs next week -No BB for now given significant volume overload and low flow state; will add as able once volume status improved -Needs LHC/RHC once euvolemic; likely will need CMR in future pending ischemic work-up  Gwyndolyn Kaufman, MD

## 2022-08-12 NOTE — Telephone Encounter (Signed)
Called patient who has now spoken with Dr Johney Frame and referral to HF clinic has been placed.

## 2022-08-16 ENCOUNTER — Other Ambulatory Visit: Payer: Self-pay | Admitting: *Deleted

## 2022-08-16 ENCOUNTER — Encounter: Payer: Self-pay | Admitting: *Deleted

## 2022-08-16 MED ORDER — ENTRESTO 49-51 MG PO TABS: 1.0000 | ORAL_TABLET | Freq: Two times a day (BID) | ORAL | 1 refills | Status: DC

## 2022-08-16 MED ORDER — ENTRESTO 49-51 MG PO TABS: 1.0000 | ORAL_TABLET | Freq: Two times a day (BID) | ORAL | 2 refills | Status: DC

## 2022-08-16 NOTE — Telephone Encounter (Signed)
-----   Message from Rollen Sox, [facility] sent at 08/16/2022  7:43 AM EDT ----- Yes, and she should be able to use the 10 dollar copay card online as well  ----- Message ----- From: Freada Bergeron, MD Sent: 08/12/2022  12:23 PM EDT To: Rollen Sox, [place]  Do I just write for it and have her try to pick it up from the pharmacy to see how much it will cost her? ----- Message ----- From: Rollen Sox, [facility] Sent: 08/12/2022  11:17 AM EDT To: Freada Bergeron, MD  I see a [employer] plan on her profile.  They should cover it. Let me know if they do not  ----- Message ----- From: Freada Bergeron, MD Sent: 08/12/2022  10:50 AM EDT To: [facility]  Her EF is 20-25%. Any chance her insurance covers entresto or do we need to go straight to patient assistance?  Thank you!!  -Nira Conn

## 2022-08-16 NOTE — Telephone Encounter (Signed)
MESSAGE BELOW IS ON BEHALF OF MELISSA MACCIA RPH:  her deductible is not rediculious and she might have already met it. She has commercial insurance so she can use the copay card - will need to be activated. Cost should be free for the first month and then $10/90 days  [email or web address]  this is the website for the free 30 day and $10 copay card     

## 2022-08-16 NOTE — Telephone Encounter (Signed)
Dr. Johney Frame spoke with the pt on the phone about stopping valsartan and starting Entresto 49/51 mg po bid.  Pt aware we will be mycharting her the website to go to, to activated her copay card.  She is aware she will need to go to the website as indicated in this message to activate this.    Pt verbalized understanding and agrees with this plan.  Mychart message will be sent to the pt.

## 2022-08-17 ENCOUNTER — Other Ambulatory Visit: Payer: Managed Care, Other (non HMO)

## 2022-08-25 ENCOUNTER — Telehealth (HOSPITAL_COMMUNITY): Payer: Self-pay | Admitting: Surgery

## 2022-08-25 NOTE — Telephone Encounter (Signed)
Patient contacted regarding referral and scheduling in the [facility].  Patient appt scheduled Oct. 24th.

## 2022-08-28 NOTE — Progress Notes (Unsigned)
Office Visit    Patient Name: Mary Rios Date of Encounter: 08/28/2022  Primary Care Provider:  Vonna Drafts, FNP Primary Cardiologist:  None Primary Electrophysiologist: None  Chief Complaint    Mary Rios is a 46 y.o. female with PMH of hypertension, HFrEF who presents today for 2-week follow-up of combined systolic and diastolic CHF and HTN.  Past Medical History    Past Medical History:  Diagnosis Date   Edema of both lower extremities    Fibroids    Hypertension    Hypertensive urgency    Past Surgical History:  Procedure Laterality Date   CESAREAN SECTION      Allergies  No Known Allergies  History of Present Illness    Mary Rios  is a 46 year old female with the above mention past medical history who presents today for follow-up of HTN and CHF.  Mary Rios was initially seen in the urgent care at [facility] on 06/2022 with complaint of lower extremity edema.  She was found to have BNP of 776 and was discharged with Lasix with recommendation to follow-up with cardiology.  She was initially seen by Dr. Johney Frame on 08/10/2022 with complaint of lower extremity edema and dyspnea on exertion.  During visit patient denied chest pain but endorsed two-pillow orthopnea that has occurred over the previous 2 months.  Patient's blood pressure during visit was 148/114 and 150/110 on recheck.  She was transition from HCTZ to valsartan and Lasix was increased with spironolactone initiated.  2D echo was completed with reduced EF of 20-25% with severely decreased LV function and global hypokinesis, grade 2 DD with flattened interventricular septum in diastole due to RV overload, moderately reduced RV, elevated PA pressures of 51 mmHg and moderately dilated LA/RA with a small pericardial effusion present.  Mild MV regurgitation noted with severe right TV regurgitation and trivial AV regurgitation.   Mary Rios presents today for 2-week follow-up of new onset CHF.  Since  last being seen in the office patient reports she has been doing much better and fluid volume has improved since previous visit.  She reports chest discomfort while lying on her left side at night that she describes as a pinching sensation and is relieved with Tylenol.  She is currently tolerating her medications without any adverse reactions.  Her blood pressure today was elevated at 138/98 initially and 142/92 on recheck.  During the visit we discussed the pathophysiology of heart failure and the clinical pathway that will take place over the next few months.  She has a follow-up scheduled also with the advanced heart failure clinic tomorrow.  Patient denies chest pain, palpitations, dyspnea, PND, orthopnea, nausea, vomiting, dizziness, syncope, edema, weight gain, or early satiety.   Home Medications    Current Outpatient Medications  Medication Sig Dispense Refill   busPIRone (BUSPAR) 5 MG tablet Take 1 tablet (5 mg total) by mouth 2 (two) times daily. 60 tablet 0   furosemide (LASIX) 40 MG tablet Take 1 tablet (40 mg total) by mouth twice daily for 5 days only, then decrease to taking 1 tablet (40 mg total) by mouth daily thereafter. 42 tablet 0   Multiple Vitamin (MULTIVITAMIN) capsule Take 1 capsule by mouth daily.     potassium chloride SA (KLOR-CON M) 20 MEQ tablet Take 1 tablet (20 mEq total) by mouth twice daily for 5 days only, then decrease to taking 1 tablet (20 mEq total) by mouth daily thereafter. 42 tablet 0  sacubitril-valsartan (ENTRESTO) 49-51 MG Take 1 tablet by mouth 2 (two) times daily. 180 tablet 1   spironolactone (ALDACTONE) 25 MG tablet Take 0.5 tablets (12.5 mg total) by mouth daily. 45 tablet 3   No current facility-administered medications for this visit.     Review of Systems  Please see the history of present illness.    (+) Lower extremity edema (+) Chest pressure  All other systems reviewed and are otherwise negative except as noted above.  Physical Exam     Wt Readings from Last 3 Encounters:  08/10/22 144 lb 12.8 oz (65.7 kg)  05/19/22 132 lb 15 oz (60.3 kg)  01/28/20 133 lb (60.3 kg)   PN:TIRWE were no vitals filed for this visit.,There is no height or weight on file to calculate BMI.  Constitutional:      Appearance: Healthy appearance. Not in distress.  Neck:     Vascular: JVD normal.  Pulmonary:     Effort: Pulmonary effort is normal.     Breath sounds: No wheezing. No rales. Diminished in the bases Cardiovascular:     Normal rate. Regular rhythm. Normal S1. Normal S2.      Murmurs: Diastolic murmur present Edema:    Peripheral edema absent.  Abdominal:     Palpations: Abdomen is soft non tender. There is no hepatomegaly.  Skin:    General: Skin is warm and dry.  Neurological:     General: No focal deficit present.     Mental Status: Alert and oriented to person, place and time.     Cranial Nerves: Cranial nerves are intact.  EKG/LABS/Other Studies Reviewed    ECG personally reviewed by me today -sinus rhythm with rate of 93 bpm and T wave inversion in lead III, V5 and V6 and poor R wave progression.  These findings are consistent with previous EKG and are not acute   Lab Results  Component Value Date   WBC 5.5 06/28/2022   HGB 8.1 (L) 06/28/2022   HCT 28.6 (L) 06/28/2022   MCV 53.2 (L) 06/28/2022   PLT 355 06/28/2022   Lab Results  Component Value Date   CREATININE 0.78 08/10/2022   BUN 9 08/10/2022   NA 137 08/10/2022   K 4.3 08/10/2022   CL 102 08/10/2022   CO2 22 08/10/2022   Lab Results  Component Value Date   ALT 13 01/10/2020   AST 20 01/10/2020   ALKPHOS 51 01/10/2020   BILITOT 0.3 01/10/2020   No results found for: "CHOL", "HDL", "LDLCALC", "LDLDIRECT", "TRIG", "CHOLHDL"  No results found for: "HGBA1C"  Assessment & Plan    1.  Chronic combined systolic and diastolic CHF: -2D echo completed revealing severely decreased LV and RV function with dilated RA/LA and elevated PA pressures.  Patient  has visit coming up with advanced heart failure clinic. -Today patient reports she is doing much better with no shortness of breath however she does endorse chest discomfort when lying flat on the left side. -We will send patient for right and left heart catheterization to evaluate LV function and right heart pressures -Patient still has slight lower extremity edema noted since previous visit -We will titrate Aldactone to 25 mg today, continue potassium 20 mEq daily -We will check BMET, CBC today and may increase Entresto and titrate Lasix based on results  2.  Essential hypertension: -Patient's blood pressure today was elevated at 142/92 -Spironolactone increased and Entresto will be titrated up pending lab results  3.  Chest discomfort: -Patient  reports chest discomfort while lying on her left side that occurs at night and is resolved with Tylenol. -She denies any associated shortness of breath with chest discomfort and discomfort is not reproduced with activity. -Patient encouraged to reach out to  Disposition: Follow-up with None or APP in 2 weeks Shared Decision Making/Informed Consent The risks [stroke (1 in 1000), death (1 in 1000), kidney failure [usually temporary] (1 in 500), bleeding (1 in 200), allergic reaction [possibly serious] (1 in 200)], benefits (diagnostic support and management of coronary artery disease) and alternatives of a cardiac catheterization were discussed in detail with Mary Rios and she is willing to proceed.   Medication Adjustments/Labs and Tests Ordered: Current medicines are reviewed at length with the patient today.  Concerns regarding medicines are outlined above.   Signed, Mable Fill, Marissa Nestle, NP 08/28/2022, 3:10 PM [facility]  Note:  This document was prepared using Dragon voice recognition software and may include unintentional dictation errors.

## 2022-08-29 ENCOUNTER — Encounter: Payer: Self-pay | Admitting: *Deleted

## 2022-08-29 ENCOUNTER — Ambulatory Visit: Payer: Managed Care, Other (non HMO) | Attending: Nurse Practitioner | Admitting: Nurse Practitioner

## 2022-08-29 ENCOUNTER — Encounter: Payer: Self-pay | Admitting: Nurse Practitioner

## 2022-08-29 VITALS — BP 142/92 | HR 93 | Ht 59.0 in | Wt 129.6 lb

## 2022-08-29 DIAGNOSIS — I509 Heart failure, unspecified: Secondary | ICD-10-CM | POA: Diagnosis not present

## 2022-08-29 DIAGNOSIS — Z79899 Other long term (current) drug therapy: Secondary | ICD-10-CM

## 2022-08-29 DIAGNOSIS — I5020 Unspecified systolic (congestive) heart failure: Secondary | ICD-10-CM | POA: Diagnosis not present

## 2022-08-29 DIAGNOSIS — I110 Hypertensive heart disease with heart failure: Secondary | ICD-10-CM | POA: Diagnosis not present

## 2022-08-29 DIAGNOSIS — R0789 Other chest pain: Secondary | ICD-10-CM

## 2022-08-29 MED ORDER — SPIRONOLACTONE 25 MG PO TABS: 25.0000 mg | ORAL_TABLET | Freq: Every day | ORAL | 3 refills | Status: DC

## 2022-08-29 MED ORDER — SODIUM CHLORIDE 0.9% FLUSH: 3.0000 mL | Freq: Two times a day (BID) | INTRAVENOUS | Status: DC

## 2022-08-29 NOTE — Patient Instructions (Addendum)
  Medication Instructions:    START TAKING: SPRINOLACTONE  25 MG ONCE A DAY   *If you need a refill on your cardiac medications before your next appointment, please call your pharmacy*   Lab Work:  BMET AND CBC TODAY    If you have labs (blood work) drawn today and your tests are completely normal, you will receive your results only by: [place] (if you have MyChart) OR A paper copy in the mail If you have any lab test that is abnormal or we need to change your treatment, we will call you to review the results.   Testing/Procedures: Your physician has requested that you have a cardiac catheterization. Cardiac catheterization is used to diagnose and/or treat various heart conditions. Doctors may recommend this procedure for a number of different reasons. The most common reason is to evaluate chest pain. Chest pain can be a symptom of coronary artery disease (CAD), and cardiac catheterization can show whether plaque is narrowing or blocking your heart's arteries. This procedure is also used to evaluate the valves, as well as measure the blood flow and oxygen levels in different parts of your heart. For further information please visit [email or web address]. Please follow instruction sheet, as given.    Follow-Up: At [facility], you and your health needs are our priority.  As part of our continuing mission to provide you with exceptional heart care, we have created designated Provider Care Teams.  These Care Teams include your primary Cardiologist (physician) and Advanced Practice Providers (APPs -  Physician Assistants and Nurse Practitioners) who all work together to provide you with the care you need, when you need it.  We recommend signing up for the patient portal called "MyChart".  Sign up information is provided on this After Visit Summary.  MyChart is used to connect with patients for Virtual Visits (Telemedicine).  Patients are able to view lab/test results, encounter  notes, upcoming appointments, etc.  Non-urgent messages can be sent to your provider as well.   To learn more about what you can do with MyChart, go to [email or web address].    Your next appointment:   2 week(s)  The format for your next appointment:   In Person  Provider:   Ambrose Pancoast, NP      Other Instructions:   Important Information About Sugar

## 2022-08-30 ENCOUNTER — Other Ambulatory Visit (HOSPITAL_COMMUNITY): Payer: Self-pay

## 2022-08-30 ENCOUNTER — Telehealth (HOSPITAL_COMMUNITY): Payer: Self-pay

## 2022-08-30 ENCOUNTER — Telehealth: Payer: Self-pay

## 2022-08-30 ENCOUNTER — Ambulatory Visit (HOSPITAL_COMMUNITY)
Admission: RE | Admit: 2022-08-30 | Discharge: 2022-08-30 | Disposition: A | Discharge status: Home / Self Care | Payer: Managed Care, Other (non HMO) | Source: Ambulatory Visit | Attending: Cardiology | Admitting: Cardiology

## 2022-08-30 VITALS — BP 180/120 | HR 107 | Wt 131.6 lb

## 2022-08-30 DIAGNOSIS — D259 Leiomyoma of uterus, unspecified: Secondary | ICD-10-CM | POA: Insufficient documentation

## 2022-08-30 DIAGNOSIS — Z86018 Personal history of other benign neoplasm: Secondary | ICD-10-CM | POA: Diagnosis not present

## 2022-08-30 DIAGNOSIS — R262 Difficulty in walking, not elsewhere classified: Secondary | ICD-10-CM | POA: Insufficient documentation

## 2022-08-30 DIAGNOSIS — I5020 Unspecified systolic (congestive) heart failure: Secondary | ICD-10-CM | POA: Diagnosis not present

## 2022-08-30 DIAGNOSIS — Z79899 Other long term (current) drug therapy: Secondary | ICD-10-CM | POA: Insufficient documentation

## 2022-08-30 DIAGNOSIS — R053 Chronic cough: Secondary | ICD-10-CM | POA: Insufficient documentation

## 2022-08-30 DIAGNOSIS — I509 Heart failure, unspecified: Secondary | ICD-10-CM | POA: Diagnosis not present

## 2022-08-30 DIAGNOSIS — I110 Hypertensive heart disease with heart failure: Secondary | ICD-10-CM | POA: Diagnosis present

## 2022-08-30 LAB — CBC
Hematocrit: 31.5 % — ABNORMAL LOW (ref 34.0–46.6)
Hemoglobin: 8.5 g/dL — ABNORMAL LOW (ref 11.1–15.9)
MCH: 15.8 pg — ABNORMAL LOW (ref 26.6–33.0)
MCHC: 27 g/dL — ABNORMAL LOW (ref 31.5–35.7)
MCV: 59 fL — ABNORMAL LOW (ref 79–97)
Platelets: 356 10*3/uL (ref 150–450)
RBC: 5.37 x10E6/uL — ABNORMAL HIGH (ref 3.77–5.28)
RDW: 23.5 % — ABNORMAL HIGH (ref 11.7–15.4)
WBC: 5 10*3/uL (ref 3.4–10.8)

## 2022-08-30 LAB — BASIC METABOLIC PANEL
BUN/Creatinine Ratio: 13 (ref 9–23)
BUN: 11 mg/dL (ref 6–24)
CO2: 25 mmol/L (ref 20–29)
Calcium: 9.6 mg/dL (ref 8.7–10.2)
Chloride: 102 mmol/L (ref 96–106)
Creatinine, Ser: 0.86 mg/dL (ref 0.57–1.00)
Glucose: 110 mg/dL — ABNORMAL HIGH (ref 70–99)
Potassium: 3.6 mmol/L (ref 3.5–5.2)
Sodium: 137 mmol/L (ref 134–144)
eGFR: 84 mL/min/{1.73_m2} (ref >59)

## 2022-08-30 MED ORDER — FUROSEMIDE 40 MG PO TABS: ORAL_TABLET | ORAL | 3 refills | Status: DC

## 2022-08-30 MED ORDER — ENTRESTO 97-103 MG PO TABS: 1.0000 | ORAL_TABLET | Freq: Two times a day (BID) | ORAL | 11 refills | Status: DC

## 2022-08-30 MED ORDER — HYDRALAZINE HCL 25 MG PO TABS: 12.5000 mg | ORAL_TABLET | Freq: Three times a day (TID) | ORAL | 3 refills | Status: DC

## 2022-08-30 MED ORDER — DAPAGLIFLOZIN PROPANEDIOL 10 MG PO TABS: 10.0000 mg | ORAL_TABLET | Freq: Every day | ORAL | 11 refills | Status: DC

## 2022-08-30 NOTE — Telephone Encounter (Signed)
Pt called and shared results from Mr. Mary Pancoast NP.    Renal function and potassium are both within normal limits. Blood counts are also stable with previously known anemia.  Pt stated she saw Dr. Thornell [facility]ios [facility] today and new Lasix / Delene Loll / Hydralazine / Wilder Glade, meds already entered into Epic.    Pt stated she is to have BMET checked in 1 week.  Pt advised to follow Dr. Thornell Rios orders.  Pt asked / stated she understood Dr. Thornell Rios POC;  Pt told to call [facility] with any concerns or new symptoms.

## 2022-08-30 NOTE — Progress Notes (Signed)
ADVANCED HEART FAILURE CLINIC NOTE  Referring Physician: Vonna Drafts, FNP  Primary Care: Vonna Drafts, FNP Primary Cardiologist: Dr. Johney Frame  HPI: Mary Rios is a 46 y.o. female with HTN that began to have symptoms of SOB and LE edema in the summer of 2023. She went to the [facility] urgent care in August 23 where she was started on PO lasix. Subsequently she followed up with [facility] where she was notably hypervolemic, started on appropriate GDMT and diuresed aggressively. TTE was remarkable for LVEF of 20-25%. After diuresis, Mary Rios reports transient improvement in functional status. Mary Rios previously worked at [employer]. Prior to this summer, Mary Rios has previously never felt chronically SOB; no CP, LE edema. She otherwise only has a history of uterine fibroids that have not been followed up in several years.   Today Mary Rios reports significant dyspnea with walking; difficulty laying flat in bed and a persistent cough. In addition, she is concerned about her uterine fibroids which are now visible through her clothing. No chest pain, lightheadedness or angina; reports compliance with all her medications.   Activity level/exercise tolerance:  NYHA III Orthopnea:  Sleeps on 2-3 pillows Paroxysmal noctural dyspnea:  yes Chest pain/pressure:  no Orthostatic lightheadedness:  no Palpitations:  no Lower extremity edema:  yes Presyncope/syncope:  no Cough:  yes  Past Medical History:  Diagnosis Date   Edema of both lower extremities    Fibroids    Hypertension    Hypertensive urgency     Current Outpatient Medications  Medication Sig Dispense Refill   busPIRone (BUSPAR) 5 MG tablet Take 1 tablet (5 mg total) by mouth 2 (two) times daily. 60 tablet 0   furosemide (LASIX) 40 MG tablet Take 1 tablet (40 mg total) by mouth twice daily for 5 days only, then decrease to taking 1 tablet (40 mg total) by mouth daily thereafter. 42 tablet 0   Multiple Vitamin  (MULTIVITAMIN) capsule Take 1 capsule by mouth daily.     potassium chloride SA (KLOR-CON M) 20 MEQ tablet Take 1 tablet (20 mEq total) by mouth twice daily for 5 days only, then decrease to taking 1 tablet (20 mEq total) by mouth daily thereafter. 42 tablet 0   sacubitril-valsartan (ENTRESTO) 49-51 MG Take 1 tablet by mouth 2 (two) times daily. 180 tablet 1   spironolactone (ALDACTONE) 25 MG tablet Take 1 tablet (25 mg total) by mouth daily. 90 tablet 3   Current Facility-Administered Medications  Medication Dose Route Frequency Provider Last Rate Last Admin   sodium chloride flush (NS) 0.9 % injection 3 mL  3 mL Intravenous Q12H Barbarann Ehlers Junius Creamer., NP        No Known Allergies    Social History   Socioeconomic History   Marital status: Single    Spouse name: Not on file   Number of children: Not on file   Years of education: Not on file   Highest education level: Not on file  Occupational History   Not on file  Tobacco Use   Smoking status: Never   Smokeless tobacco: Never  Vaping Use   Vaping Use: Never used  Substance and Sexual Activity   Alcohol use: Yes    Comment: rare   Drug use: No   Sexual activity: Yes    Birth control/protection: Condom  Other Topics Concern   Not on file  Social History Narrative   Not on file   Social Determinants of Health  Financial Resource Strain: Not on file  Food Insecurity: Not on file  Transportation Needs: Not on file  Physical Activity: Not on file  Stress: Not on file  Social Connections: Not on file  Intimate Partner Violence: Not on file      Family History  Problem Relation Age of Onset   Hypertension Mother    Diabetes Mother     PHYSICAL EXAM: There were no vitals filed for this visit. GENERAL: Well nourished, well developed, and in no apparent distress at rest.  HEENT: Negative for arcus senilis or xanthelasma. There is no scleral icterus.  The mucous membranes are pink and moist.   NECK: Supple, No masses.  Normal carotid upstrokes without bruits. No masses or thyromegaly.    CHEST: There are no chest wall deformities. There is no chest wall tenderness. Respirations are unlabored.  Lungs- basilar inspiratory crackles CARDIAC:  JVP: 14 cm H2O with V waves         Normal S1, S2  Normal rate with regular rhythm. No murmurs, rubs or gallops.  Pulses are 2+ and symmetrical in upper and lower extremities. 3+ edema. Warm to touch ABDOMEN: Soft, non-tender, non-distended. There are no masses or hepatomegaly. There are normal bowel sounds.  EXTREMITIES: Warm and well perfused with no cyanosis, clubbing.  LYMPHATIC: No axillary or supraclavicular lymphadenopathy.  NEUROLOGIC: Patient is oriented x3 with no focal or lateralizing neurologic deficits.  PSYCH: Patients affect is appropriate, there is no evidence of anxiety or depression.  SKIN: Warm and dry; no lesions or wounds.   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  NYHA class / AHA Stage:III Volume status & Diuretics: Severely volume overloaded with pitting edema to the knees and JVP to 14cm; however, no overt signs of shock or low output. Will increase lasix to '80mg'$  BID with plan to repeat labs in 1 week. Adding farxiga.  Vasodilators:Increase Entresto to 97/103 BID; add hydralazine 12.'5mg'$  TID Beta-Blocker:holding; start on pharmacy follow up; severely volume overloaded today CBS:[id number] '25mg'$  daily Cardiometabolic:start farxiga  Devices therapies & Valvulopathies:N/A Advanced therapies: After diuresis, will pursue RHC/LHC and CMR for further evaluation. If LVEF does not recover plan for CPX for further prognostification.   2. Hypertension - Significantly elevated today in  the setting of systolic heart failure; increase entresto, adding hydralazine.   3. Uterine Fibroids - Extremely large uterine fibroids on exam today occupying a significant portion of the lower abdomen;  - GYN appointment scheduled for next Monday.  - I am fairly concerned about the size of the mass palpated in her abdomen; Pelvic ultrasound from 4/21 notes fairly enlarged fibroids which I believe are much larger now. They are very likely contributing to her symptoms at this time.    Lynel Forester Advanced Heart Failure Mechanical Circulatory Support

## 2022-08-30 NOTE — Telephone Encounter (Signed)
-----   Message from Marylu Lund., NP sent at 08/30/2022  8:22 AM EDT ----- Renal function and potassium are both within normal limits. Blood counts are also stable with previously known anemia.  Plan:  -Please instruct patient to increase Lasix to 40 mg twice daily x3 days and then 20 daily.Increase potassium 20 mEq twice daily x3 days then 20 mEq daily -Please increase Entresto to 97/103 mg twice daily -Please recheck BMET in 1 week   Please advise her to continue a lower sodium diet (aiming for maximum 2,'000mg'$  per day) and staying hydrated but not to excess. In general about 64oz per day of all fluid intake would be an ideal maximum.   Ambrose Pancoast, NP

## 2022-08-30 NOTE — Patient Instructions (Addendum)
INCREASE Entresto to 97/103 Twice daily  INCREASE Lasix to '80mg'$  Twice daily for 4 days then, take '40mg'$  Twice daily  START  Hydralazine 12.'5mg'$  Three times a day  START Farxiga '10mg'$  daily    Blood work in a wee.  Please follow up with our heart failure pharmacist in 3 weeks  Your physician recommends that you schedule a follow-up appointment in: 1 month.  If you have any questions or concerns before your next appointment please send [place] a message through [place] or call our office at [phone].    TO LEAVE A MESSAGE FOR THE NURSE SELECT OPTION 2, PLEASE LEAVE A MESSAGE INCLUDING: YOUR NAME DATE OF BIRTH CALL BACK NUMBER REASON FOR CALL**this is important as we prioritize the call backs  YOU WILL RECEIVE A CALL BACK THE SAME DAY AS LONG AS YOU CALL BEFORE 4:00 PM  At the [facility], you and your health needs are our priority. As part of our continuing mission to provide you with exceptional heart care, we have created designated Provider Care Teams. These Care Teams include your primary Cardiologist (physician) and Advanced Practice Providers (APPs- Physician Assistants and Nurse Practitioners) who all work together to provide you with the care you need, when you need it.   You may see any of the following providers on your designated Care Team at your next follow up: Dr Glori Bickers Dr Loralie Champagne Dr. Roxana Hires, NP Lyda Jester, [place] [facility] [place], [place] Forestine Na, NP Audry Riles, PharmD   Please be sure to bring in all your medications bottles to every appointment.

## 2022-08-30 NOTE — Telephone Encounter (Signed)
Left patient a message to call me back regarding lab results and new medication orders per Mr Ambrose Pancoast, NP.    Follow up required.

## 2022-08-30 NOTE — Telephone Encounter (Signed)
Advanced Heart Failure Patient Advocate Encounter  Prior authorization is required for [place]. [place] submitted and APPROVED on 08/30/2022.  Key [id number]  Effective: 08/30/2022 - 11/06/2098  Clista Bernhardt, CPhT Rx Patient Advocate Phone: [phone]

## 2022-09-01 ENCOUNTER — Telehealth: Payer: Self-pay | Admitting: *Deleted

## 2022-09-01 NOTE — Telephone Encounter (Signed)
Call placed to patient to review instructions for [facility] scheduled for Monday September 05, 2022. Patient reports that after office visit in Heart Failure Clinic 08/30/22 that it was her understanding that [facility] would be rescheduled for a later date after evaluation of uterine fibroids by OB/GYN Monday October 30.  I will forward to Ambrose Pancoast, NP and Dr Daniel Nones for further recommendations about rescheduling [facility] that is currently scheduled for October 30,2023.

## 2022-09-01 NOTE — Telephone Encounter (Signed)
Per patient request and understanding-R/LHC scheduled 09/05/22 has been cancelled until further evaluation of uterine fibroids and recommendation of provider.

## 2022-09-05 ENCOUNTER — Encounter (HOSPITAL_COMMUNITY): Admission: RE | Payer: Self-pay | Source: Home / Self Care

## 2022-09-05 ENCOUNTER — Ambulatory Visit (HOSPITAL_COMMUNITY)
Admission: RE | Admit: 2022-09-05 | Payer: Managed Care, Other (non HMO) | Source: Home / Self Care | Admitting: Cardiology

## 2022-09-05 SURGERY — RIGHT/LEFT HEART CATH AND CORONARY ANGIOGRAPHY
Anesthesia: LOCAL

## 2022-09-06 ENCOUNTER — Other Ambulatory Visit: Payer: Self-pay | Admitting: Obstetrics and Gynecology

## 2022-09-06 DIAGNOSIS — R1900 Intra-abdominal and pelvic swelling, mass and lump, unspecified site: Secondary | ICD-10-CM

## 2022-09-07 ENCOUNTER — Ambulatory Visit (HOSPITAL_COMMUNITY)
Admission: RE | Admit: 2022-09-07 | Discharge: 2022-09-07 | Disposition: A | Discharge status: Home / Self Care | Payer: Managed Care, Other (non HMO) | Source: Ambulatory Visit | Attending: Internal Medicine | Admitting: Internal Medicine

## 2022-09-07 ENCOUNTER — Telehealth (HOSPITAL_COMMUNITY): Payer: Self-pay

## 2022-09-07 DIAGNOSIS — I5020 Unspecified systolic (congestive) heart failure: Secondary | ICD-10-CM | POA: Insufficient documentation

## 2022-09-07 LAB — BASIC METABOLIC PANEL
Anion gap: 9 (ref 5–15)
BUN: 8 mg/dL (ref 6–20)
CO2: 25 mmol/L (ref 22–32)
Calcium: 9 mg/dL (ref 8.9–10.3)
Chloride: 106 mmol/L (ref 98–111)
Creatinine, Ser: 0.86 mg/dL (ref 0.44–1.00)
GFR, Estimated: >60 mL/min (ref >60)
Glucose, Bld: 111 mg/dL — ABNORMAL HIGH (ref 70–99)
Potassium: 3.1 mmol/L — ABNORMAL LOW (ref 3.5–5.1)
Sodium: 140 mmol/L (ref 135–145)

## 2022-09-07 LAB — BRAIN NATRIURETIC PEPTIDE: B Natriuretic Peptide: 930.4 pg/mL — ABNORMAL HIGH (ref 0.0–100.0)

## 2022-09-07 NOTE — Telephone Encounter (Signed)
Received a fax requesting medical records from [place]. Records were successfully faxed to: [phone] ,which was the number provided.. Medical request form will be scanned into patients chart.

## 2022-09-09 ENCOUNTER — Other Ambulatory Visit (HOSPITAL_COMMUNITY): Payer: Self-pay

## 2022-09-12 ENCOUNTER — Other Ambulatory Visit: Payer: Managed Care, Other (non HMO)

## 2022-09-20 ENCOUNTER — Ambulatory Visit
Admission: RE | Admit: 2022-09-20 | Discharge: 2022-09-20 | Disposition: A | Discharge status: Home / Self Care | Payer: Managed Care, Other (non HMO) | Source: Ambulatory Visit | Attending: Obstetrics and Gynecology | Admitting: Obstetrics and Gynecology

## 2022-09-20 DIAGNOSIS — R1900 Intra-abdominal and pelvic swelling, mass and lump, unspecified site: Secondary | ICD-10-CM

## 2022-09-20 NOTE — Progress Notes (Unsigned)
Office Visit    Patient Name: Mary Rios Date of Encounter: 09/22/2022  Primary Care Provider:  Vonna Drafts, FNP Primary Cardiologist:  None Primary Electrophysiologist: None  Chief Complaint    Mary Rios is a 46 y.o. female with PMH of hypertension, HFrEF who presents today for 2-week follow-up of heart failure with reduced EF.  Past Medical History    Past Medical History:  Diagnosis Date   Edema of both lower extremities    Fibroids    Hypertension    Hypertensive urgency    Past Surgical History:  Procedure Laterality Date   CESAREAN SECTION      Allergies  No Known Allergies  History of Present Illness    Mary Rios  is a 46 year old female with the above mention past medical history who presents today for follow-up of HTN and CHF.  Mary Rios was initially seen in the urgent care at [facility] on 06/2022 with complaint of lower extremity edema.  She was found to have BNP of 776 and was discharged with Lasix with recommendation to follow-up with cardiology.  She was initially seen by Dr. Johney Frame on 08/10/2022 with complaint of lower extremity edema and dyspnea on exertion.  During visit patient denied chest pain but endorsed two-pillow orthopnea that has occurred over the previous 2 months.  Patient's blood pressure during visit was 148/114 and 150/110 on recheck.  She was transition from HCTZ to valsartan and Lasix was increased with spironolactone initiated.  2D echo was completed with reduced EF of 20-25% with severely decreased LV function and global hypokinesis, grade 2 DD with flattened interventricular septum in diastole due to RV overload, moderately reduced RV, elevated PA pressures of 51 mmHg and moderately dilated LA/RA with a small pericardial effusion present.  Mild MV regurgitation noted with severe right TV regurgitation and trivial AV regurgitation.  She was seen by me in follow-up 08/29/2022 with fluid volume improved and complaint of  itching in the chest when laying down.  Discussed case with Dr. Johney Frame who recommended right and left heart cath for further evaluation.  She was started on spironolactone 25 mg and Entresto was titrated to 97/103 along with Lasix increased to 40 mg twice daily x3 days and then 20 mg daily.   Mary Rios presents today for 2-week follow-up alone.  Since last being seen in the office patient reports that she thinks that her fluid has gone down however she does endorse shortness of breath especially at night when laying flat.  She also endorses a productive cough with clear frothy sputum.  She is volume up on exam today with JVD present and lower extremity edema.  Her blood pressure today was elevated at 150/90 and on recheck was 146/98.  Her heart rate was elevated today at 104 bpm and she reports that she drinks 32 ounces of water daily.  Advised her to increase her fluid intake to 64 ounces to avoid possible dehydration.  She reports that she has not started her Delene Loll as of yet.  She is compliant with her blood pressure medications and was advised to communicate to [place] if she is not able to obtain any medications in the future.  Provided her with patient assistance paperwork for [facility] and she was instructed to return to me or advanced heart failure team before completion.  Since being seen in our office she has followed up with the advanced heart failure clinic and she will have a follow-up  in the next 2 weeks.  She reports that Dr.Sabharwal and her GYN specialist will discuss her current treatment plan and when to schedule her R/LHC.  Patient denies chest pain, palpitations, dyspnea, PND, orthopnea, nausea, vomiting, dizziness, syncope, edema, weight gain, or early satiety.  Home Medications    Current Outpatient Medications  Medication Sig Dispense Refill   busPIRone (BUSPAR) 5 MG tablet Take 1 tablet (5 mg total) by mouth 2 (two) times daily. 60 tablet 0   dapagliflozin propanediol (FARXIGA) 10  MG TABS tablet Take 1 tablet (10 mg total) by mouth daily before breakfast. 30 tablet 11   furosemide (LASIX) 40 MG tablet Take 1 tablet (40 mg total) by mouth twice daily 180 tablet 3   Multiple Vitamin (MULTIVITAMIN) capsule Take 1 capsule by mouth daily.     sacubitril-valsartan (ENTRESTO) 97-103 MG Take 1 tablet by mouth 2 (two) times daily. 60 tablet 11   spironolactone (ALDACTONE) 25 MG tablet Take 1 tablet (25 mg total) by mouth daily. 90 tablet 3   hydrALAZINE (APRESOLINE) 25 MG tablet Take 1 tablet (25 mg total) by mouth 3 (three) times daily. 90 tablet 3   potassium chloride SA (KLOR-CON M) 20 MEQ tablet Take 1 tablet (20 mEq total) by mouth twice daily for 5 days only, then decrease to taking 1 tablet (20 mEq total) by mouth daily thereafter. (Patient not taking: Reported on 09/22/2022) 42 tablet 0   Current Facility-Administered Medications  Medication Dose Route Frequency Provider Last Rate Last Admin   sodium chloride flush (NS) 0.9 % injection 3 mL  3 mL Intravenous Q12H Marylu Lund., NP         Review of Systems  Please see the history of present illness.    (+) Lower extremity edema and shortness of breath with exertion (+) Productive cough orthopnea  All other systems reviewed and are otherwise negative except as noted above.  Physical Exam    Wt Readings from Last 3 Encounters:  09/22/22 134 lb (60.8 kg)  09/21/22 133 lb (60.3 kg)  08/30/22 131 lb 9.6 oz (59.7 kg)   VS: Vitals:   09/22/22 1227 09/22/22 1239  BP: (!) 146/102 (!) 146/98  Pulse:    SpO2:    ,Body mass index is 27.06 kg/m.  Constitutional:      Appearance: Healthy appearance. Not in distress.  Neck:     Vascular: JVD normal.  Pulmonary:     Effort: Pulmonary effort is normal.     Breath sounds: No wheezing. No rales. Diminished in the bases Cardiovascular:     Normal rate. Regular rhythm. Normal S1. Normal S2.      Murmurs: There is no murmur.  Edema:    Peripheral edema absent.   Abdominal:     Palpations: Abdomen is soft non tender. There is no hepatomegaly.  Skin:    General: Skin is warm and dry.  Neurological:     General: No focal deficit present.     Mental Status: Alert and oriented to person, place and time.     Cranial Nerves: Cranial nerves are intact.  EKG/LABS/Other Studies Reviewed    ECG personally reviewed by me today -sinus tach with left axis deviation and rate of 104 bpm with no acute changes noted and possible LVH.     Lab Results  Component Value Date   WBC 5.0 08/29/2022   HGB 8.5 (L) 08/29/2022   HCT 31.5 (L) 08/29/2022   MCV 59 (L) 08/29/2022  PLT 356 08/29/2022   Lab Results  Component Value Date   CREATININE 0.87 09/21/2022   BUN 10 09/21/2022   NA 138 09/21/2022   K 3.4 (L) 09/21/2022   CL 109 09/21/2022   CO2 22 09/21/2022   Lab Results  Component Value Date   ALT 13 01/10/2020   AST 20 01/10/2020   ALKPHOS 51 01/10/2020   BILITOT 0.3 01/10/2020   No results found for: "CHOL", "HDL", "LDLCALC", "LDLDIRECT", "TRIG", "CHOLHDL"  No results found for: "HGBA1C"  Assessment & Plan    1.  Chronic combined systolic and diastolic CHF: -2D echo completed revealing severely decreased LV and RV function with dilated RA/LA and elevated PA pressures.  Patient has visit coming up with advanced heart failure clinic next week -Today she is volume up on exam and reports compliance with her recent increase in diuretics.  She was advised to watch her salt intake and start her Delene Loll as soon as she obtains it later today. -Previous right and left heart catheterization was ordered and was placed on hold for now with plan to schedule following discussion between GYN and Dr.Sabharwal. -Patient still has slight lower extremity edema noted since previous visit and was instructed to increase her by the advanced heart failure team pharmacist yesterday. -She was advised to watch her sodium intake and was offered the salty 6 sheet and  instructed to maintain fluid volume of at least 64 ounces per day. -Low sodium diet, fluid restriction <2L, and daily weights encouraged. Educated to contact our office for weight gain of 2 lbs overnight or 5 lbs in one week.    2.  Essential hypertension: -HYPERTENSION CONTROL Vitals:   09/22/22 1148 09/22/22 1227 09/22/22 1239  BP: (!) 150/90 (!) 146/102 (!) 146/98    The patient's blood pressure is elevated above target today.  In order to address the patient's elevated BP: Blood pressure will be monitored at home to determine if medication changes need to be made.; A current anti-hypertensive medication was adjusted today.; A new medication was prescribed today. (Patient has not initiated Entresto and is planning to start today.)     -She was instructed to increase hydralazine to 25 mg twice daily -Continue Aldactone 25 mg as prescribed. -She was advised to continue to check blood pressures twice a day and contact office with findings in 1 week.   3.  Chest discomfort: -Patient reports no active chest pain today but does endorse cough with lying flat.  She was advised to elevate head of bed and monitor daily weights. -Plan for [facility] to be discussed by heart failure and GYN physician with fibroids more prominent today on examination.   Disposition: Follow-up with None or APP in 1 months    Medication Adjustments/Labs and Tests Ordered: Current medicines are reviewed at length with the patient today.  Concerns regarding medicines are outlined above.   Signed, Mable Fill, Marissa Nestle, NP 09/22/2022, 12:50 PM [facility]  Note:  This document was prepared using Dragon voice recognition software and may include unintentional dictation errors.

## 2022-09-21 ENCOUNTER — Ambulatory Visit (HOSPITAL_COMMUNITY)
Admission: RE | Admit: 2022-09-21 | Discharge: 2022-09-21 | Disposition: A | Discharge status: Home / Self Care | Payer: Managed Care, Other (non HMO) | Source: Ambulatory Visit | Attending: Cardiology | Admitting: Cardiology

## 2022-09-21 ENCOUNTER — Other Ambulatory Visit (HOSPITAL_COMMUNITY): Payer: Self-pay

## 2022-09-21 ENCOUNTER — Encounter (HOSPITAL_COMMUNITY): Payer: Self-pay

## 2022-09-21 VITALS — BP 142/86 | HR 95 | Wt 133.0 lb

## 2022-09-21 DIAGNOSIS — R059 Cough, unspecified: Secondary | ICD-10-CM | POA: Insufficient documentation

## 2022-09-21 DIAGNOSIS — Z79899 Other long term (current) drug therapy: Secondary | ICD-10-CM | POA: Insufficient documentation

## 2022-09-21 DIAGNOSIS — I110 Hypertensive heart disease with heart failure: Secondary | ICD-10-CM | POA: Insufficient documentation

## 2022-09-21 DIAGNOSIS — R0609 Other forms of dyspnea: Secondary | ICD-10-CM | POA: Diagnosis not present

## 2022-09-21 DIAGNOSIS — I5022 Chronic systolic (congestive) heart failure: Secondary | ICD-10-CM | POA: Diagnosis present

## 2022-09-21 DIAGNOSIS — D259 Leiomyoma of uterus, unspecified: Secondary | ICD-10-CM | POA: Insufficient documentation

## 2022-09-21 LAB — BASIC METABOLIC PANEL
Anion gap: 7 (ref 5–15)
BUN: 10 mg/dL (ref 6–20)
CO2: 22 mmol/L (ref 22–32)
Calcium: 8.5 mg/dL — ABNORMAL LOW (ref 8.9–10.3)
Chloride: 109 mmol/L (ref 98–111)
Creatinine, Ser: 0.87 mg/dL (ref 0.44–1.00)
GFR, Estimated: >60 mL/min (ref >60)
Glucose, Bld: 147 mg/dL — ABNORMAL HIGH (ref 70–99)
Potassium: 3.4 mmol/L — ABNORMAL LOW (ref 3.5–5.1)
Sodium: 138 mmol/L (ref 135–145)

## 2022-09-21 LAB — BRAIN NATRIURETIC PEPTIDE: B Natriuretic Peptide: 813 pg/mL — ABNORMAL HIGH (ref 0.0–100.0)

## 2022-09-21 NOTE — Progress Notes (Signed)
Advanced Heart Failure Clinic Note  Referring Physician: Vonna Drafts, FNP  Primary Care: Vonna Drafts, FNP Primary Cardiologist: Dr. Johney Frame HF Cardiologist: Dr. Daniel Nones  HPI:  Mary Rios is a 46 y.o. female with HTN that began to have symptoms of SOB and LE edema in the summer of 2023. She went to the [facility] urgent care in 06/2022 where she was started on PO furosemide. Subsequently she followed up with [facility] where she was notably hypervolemic, started on appropriate GDMT and diuresed aggressively. TTE was remarkable for LVEF of 20-25%. After diuresis, Mary Rios reported transient improvement in functional status. Mary Rios previously worked at [employer]. Prior to this summer, Mary Rios has previously never felt chronically SOB; no CP, LE edema. She otherwise only has a history of uterine fibroids that have not been followed up in several years.    Presents to AHF clinic in 08/2022 to see Dr. Daniel Nones. Mary Rios reported significant dyspnea with walking; difficulty laying flat in bed and a persistent cough. In addition, she said she is concerned about her uterine fibroids which are now visible through her clothing. No chest pain, lightheadedness or angina; reported compliance with all her medications.   Today she returns to HF clinic for pharmacist medication titration. At last visit with Dr. Shawnie Dapper was increased to 97/103 mg BID and dapagliflozin were initiated. However, patient was unable to obtain either medication prior to this visit. She never picked up the [facility] and is still taking Valsartan. Furosemide was also temporarily increased to 80 mg BID x5 days, which the patient did, however upon restarting, she was taking 40 mg daily instead of BID. Overall she reports feeling a lot better, but notes consistent fluid buildup in lungs and consequent cough. She is also having issues getting to sleep and staying asleep due to discomfort in her chest area due to  fluid buildup. This had improved temporarily when she was taking furosemide 80 mg BID, but returned once she began taking 40 mg daily. Her LEE has improved significantly but has not resolved completely. She denies dizziness, lightheadedness, and fatigue. She also denies chest pain, but endorses "fluttering" sensations in her chest when at work or performing other physical activities. These episodes resolve in 20-30 mins and are always triggered by physical exertion. She says her breathing is "OK" but reports consistent DOE.  She has to take intermittent rests at work due to [place] but is able to complete all other ADLs with minimal limitations and avoids overly strenuous physical activity. She reports no significant changes in her appetite. Patient has a copay card and does not have significant financial barriers to medications.  HF Medications: Valsartan 160 mg daily  Spironolactone 25 mg daily Furosemide 40 mg daily  Understanding of regimen: poor Understanding of indications: fair Potential of compliance: fair Patient understands to avoid NSAIDs. Patient understands to avoid decongestants.    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  NYHA class / AHA Stage: III Volume status & Diuretics: Hypervolemic on exam, PND/orthopnea increased since restarting furosemide 40 mg daily.  Increase furosemide to 80 mg BID x2 days and take KCL 40 mEq x2 days.  Then start furosemide 40 mg BID and continue KCL 20 mEq daily. F/u Bmet and BNP pending. Vasodilators: Stop valsartan. Initiate Entresto 97/103 mg BID; continue hydralazine 12.5 mg  TID. Beta-Blocker:holding; continue assessing for ability to restart in future; still experiencing symptoms of volume overload but improved significantly from last visit. MRA: Continue spironolactone 25 mg daily. Cardiometabolic: Initiate dapagliflozin 10 mg daily.  Devices therapies & Valvulopathies:N/A Advanced therapies: After diuresis, will pursue RHC/LHC and CMR for further evaluation. If LVEF does not recover plan for CPX in the future.  - Repeat BMET in 2 weeks.    2. Hypertension - Elevated today in the setting of systolic heart failure; starting Entresto as above.    3. Uterine Fibroids - Extremely large uterine fibroids on exam with Dr. Daniel Nones. They occupy a significant portion of the lower abdomen.  - Pelvic ultrasound from 4/21 notes fairly enlarged fibroids which are much larger now. They are very likely contributing to her symptoms at this time.   Follow up 10/03/2022 with Dr. Daniel Nones.  Audry Riles, PharmD, BCPS, [facility], CPP Heart Failure Clinic Pharmacist [phone]

## 2022-09-21 NOTE — Patient Instructions (Signed)
It was a pleasure seeing you today!  MEDICATIONS: -We are changing your medications today -Stop valsartan and start Entresto 97/103 mg (1 tablet) twice daily -Start Farxiga 10 mg (1 tablet) daily - Take Lasix 80 mg (2 tablets) twice daily today and tomorrow. On Friday, start Lasix 40 mg (1 tablet) twice daily. - Please take an extra tablet of potassium (2 tablets total) each time you take the 80 mg of Lasix.  -Call if you have questions about your medications.  LABS: -We will call you if your labs need attention.  NEXT APPOINTMENT: Return to clinic in 2 weeks with Dr. Daniel Nones.  In general, to take care of your heart failure: -Limit your fluid intake to 2 Liters (half-gallon) per day.   -Limit your salt intake to ideally 2-3 grams (2000-3000 mg) per day. -Weigh yourself daily and record, and bring that "weight diary" to your next appointment.  (Weight gain of 2-3 pounds in 1 day typically means fluid weight.) -The medications for your heart are to help your heart and help you live longer.   -Please contact us before stopping any of your heart medications.  Call the clinic at [phone] with questions or to reschedule future appointments.

## 2022-09-22 ENCOUNTER — Encounter: Payer: Self-pay | Admitting: Nurse Practitioner

## 2022-09-22 ENCOUNTER — Ambulatory Visit: Payer: Managed Care, Other (non HMO) | Attending: Nurse Practitioner | Admitting: Nurse Practitioner

## 2022-09-22 VITALS — BP 146/98 | HR 104 | Ht 59.0 in | Wt 134.0 lb

## 2022-09-22 DIAGNOSIS — I5040 Unspecified combined systolic (congestive) and diastolic (congestive) heart failure: Secondary | ICD-10-CM

## 2022-09-22 DIAGNOSIS — R0789 Other chest pain: Secondary | ICD-10-CM | POA: Diagnosis not present

## 2022-09-22 DIAGNOSIS — I10 Essential (primary) hypertension: Secondary | ICD-10-CM | POA: Diagnosis not present

## 2022-09-22 MED ORDER — HYDRALAZINE HCL 25 MG PO TABS: 25.0000 mg | ORAL_TABLET | Freq: Three times a day (TID) | ORAL | 3 refills | Status: DC

## 2022-09-22 NOTE — Patient Instructions (Addendum)
Medication Instructions:  Your physician recommends that you continue on your current medications as directed. Please refer to the Current Medication list given to you today. *If you need a refill on your cardiac medications before your next appointment, please call your pharmacy*   Lab Work: None Ordered   Testing/Procedures: None Ordered   Follow-Up: At [facility], you and your health needs are our priority.  As part of our continuing mission to provide you with exceptional heart care, we have created designated Provider Care Teams.  These Care Teams include your primary Cardiologist (physician) and Advanced Practice Providers (APPs -  Physician Assistants and Nurse Practitioners) who all work together to provide you with the care you need, when you need it.  We recommend signing up for the patient portal called "MyChart".  Sign up information is provided on this After Visit Summary.  MyChart is used to connect with patients for Virtual Visits (Telemedicine).  Patients are able to view lab/test results, encounter notes, upcoming appointments, etc.  Non-urgent messages can be sent to your provider as well.   To learn more about what you can do with MyChart, go to [email or web address].    Your next appointment:   1 month(s)  The format for your next appointment:   In Person  Provider:   Freada Bergeron, MD   Other Instructions   Important Information About Sugar

## 2022-10-03 ENCOUNTER — Encounter (HOSPITAL_COMMUNITY): Payer: Managed Care, Other (non HMO) | Admitting: Cardiology

## 2022-11-03 NOTE — Progress Notes (Deleted)
Office Visit    Patient Name: Mary Rios Date of Encounter: 11/03/2022  Primary Care Provider:  Vonna Drafts, FNP Primary Cardiologist:  None Primary Electrophysiologist: None  Chief Complaint    Mary Rios is a 46 y.o. female with PMH of hypertension, HFrEF who presents today for 2-week follow-up of heart failure with reduced EF.   Past Medical History    Past Medical History:  Diagnosis Date   Edema of both lower extremities    Fibroids    Hypertension    Hypertensive urgency    Past Surgical History:  Procedure Laterality Date   CESAREAN SECTION      Allergies  No Known Allergies  History of Present Illness    Mary Rios  is a 46 year old female with the above mention past medical history who presents today for follow-up of HTN and CHF.  Mary Rios was initially seen in the urgent care at [facility] on 06/2022 with complaint of lower extremity edema.  She was found to have BNP of 776 and was discharged with Lasix with recommendation to follow-up with cardiology.  She was initially seen by Dr. Johney Frame on 08/10/2022 with complaint of lower extremity edema and dyspnea on exertion.  During visit patient denied chest pain but endorsed two-pillow orthopnea that has occurred over the previous 2 months.  Patient's blood pressure during visit was 148/114 and 150/110 on recheck.  She was transition from HCTZ to valsartan and Lasix was increased with spironolactone initiated.  2D echo was completed with reduced EF of 20-25% with severely decreased LV function and global hypokinesis, grade 2 DD with flattened interventricular septum in diastole due to RV overload, moderately reduced RV, elevated PA pressures of 51 mmHg and moderately dilated LA/RA with a small pericardial effusion present.  Mild MV regurgitation noted with severe right TV regurgitation and trivial AV regurgitation.  She was seen by me in follow-up 08/29/2022 with fluid volume improved and complaint of  itching in the chest when laying down.  Discussed case with Dr. Johney Frame who recommended right and left heart cath for further evaluation.  She was started on spironolactone 25 mg and Entresto was titrated to 97/103 along with Lasix increased to 40 mg twice daily x3 days and then 20 mg daily.  Mary Rios was seen 09/22/2022 for follow-up.  During visit she endorsed a productive cough with clear frothy sputum.  She was volume up on exam and JVD present with lower extremity edema.  Her blood pressure was also elevated at 150/90 and 146/98 on recheck.  She reported drinking 32 ounces or under of fluid daily.  Advised her to increase her fluid intake to at least 64 ounces to avoid dehydration.  She was provided patient assisted papers for [facility].  Since last being seen in the office patient reports***.  Patient denies chest pain, palpitations, dyspnea, PND, orthopnea, nausea, vomiting, dizziness, syncope, edema, weight gain, or early satiety.   ***Notes:  Home Medications    Current Outpatient Medications  Medication Sig Dispense Refill   busPIRone (BUSPAR) 5 MG tablet Take 1 tablet (5 mg total) by mouth 2 (two) times daily. 60 tablet 0   dapagliflozin propanediol (FARXIGA) 10 MG TABS tablet Take 1 tablet (10 mg total) by mouth daily before breakfast. 30 tablet 11   furosemide (LASIX) 40 MG tablet Take 1 tablet (40 mg total) by mouth twice daily 180 tablet 3   hydrALAZINE (APRESOLINE) 25 MG tablet Take 1 tablet (25  mg total) by mouth 3 (three) times daily. 90 tablet 3   Multiple Vitamin (MULTIVITAMIN) capsule Take 1 capsule by mouth daily.     potassium chloride SA (KLOR-CON M) 20 MEQ tablet Take 1 tablet (20 mEq total) by mouth twice daily for 5 days only, then decrease to taking 1 tablet (20 mEq total) by mouth daily thereafter. (Patient not taking: Reported on 09/22/2022) 42 tablet 0   sacubitril-valsartan (ENTRESTO) 97-103 MG Take 1 tablet by mouth 2 (two) times daily. 60 tablet 11    spironolactone (ALDACTONE) 25 MG tablet Take 1 tablet (25 mg total) by mouth daily. 90 tablet 3   Current Facility-Administered Medications  Medication Dose Route Frequency Provider Last Rate Last Admin   sodium chloride flush (NS) 0.9 % injection 3 mL  3 mL Intravenous Q12H Marylu Lund., NP         Review of Systems  Please see the history of present illness.    (+)*** (+)***  All other systems reviewed and are otherwise negative except as noted above.  Physical Exam    Wt Readings from Last 3 Encounters:  09/22/22 134 lb (60.8 kg)  09/21/22 133 lb (60.3 kg)  08/30/22 131 lb 9.6 oz (59.7 kg)   KG:MWNUU were no vitals filed for this visit.,There is no height or weight on file to calculate BMI.  Constitutional:      Appearance: Healthy appearance. Not in distress.  Neck:     Vascular: JVD normal.  Pulmonary:     Effort: Pulmonary effort is normal.     Breath sounds: No wheezing. No rales. Diminished in the bases Cardiovascular:     Normal rate. Regular rhythm. Normal S1. Normal S2.      Murmurs: There is no murmur.  Edema:    Peripheral edema absent.  Abdominal:     Palpations: Abdomen is soft non tender. There is no hepatomegaly.  Skin:    General: Skin is warm and dry.  Neurological:     General: No focal deficit present.     Mental Status: Alert and oriented to person, place and time.     Cranial Nerves: Cranial nerves are intact.  EKG/LABS/Other Studies Reviewed    ECG personally reviewed by me today - ***  Risk Assessment/Calculations:   {Does this patient have ATRIAL FIBRILLATION?:[phone]}        Lab Results  Component Value Date   WBC 5.0 08/29/2022   HGB 8.5 (L) 08/29/2022   HCT 31.5 (L) 08/29/2022   MCV 59 (L) 08/29/2022   PLT 356 08/29/2022   Lab Results  Component Value Date   CREATININE 0.87 09/21/2022   BUN 10 09/21/2022   NA 138 09/21/2022   K 3.4 (L) 09/21/2022   CL 109 09/21/2022   CO2 22 09/21/2022   Lab Results   Component Value Date   ALT 13 01/10/2020   AST 20 01/10/2020   ALKPHOS 51 01/10/2020   BILITOT 0.3 01/10/2020   No results found for: "CHOL", "HDL", "LDLCALC", "LDLDIRECT", "TRIG", "CHOLHDL"  No results found for: "HGBA1C"  Assessment & Plan    1.1.  Chronic combined systolic and diastolic CHF: -2D echo completed revealing severely decreased LV and RV function with dilated RA/LA and elevated PA pressures.  Patient has visit coming up with advanced heart failure clinic next week -Today she is volume up on exam and reports compliance with her recent increase in diuretics.  She was advised to watch her salt intake and start her  Entresto as soon as she obtains it later today. -Previous right and left heart catheterization was ordered and was placed on hold for now with plan to schedule following discussion between GYN and Dr.Sabharwal. -Patient still has slight lower extremity edema noted since previous visit and was instructed to increase her by the advanced heart failure team pharmacist yesterday. -She was advised to watch her sodium intake and was offered the salty 6 sheet and instructed to maintain fluid volume of at least 64 ounces per day. -Low sodium diet, fluid restriction <2L, and daily weights encouraged. Educated to contact our office for weight gain of 2 lbs overnight or 5 lbs in one week.   2.Chest discomfort: -Patient reports no active chest pain today but does endorse cough with lying flat.  She was advised to elevate head of bed and monitor daily weights. -Plan for [facility] to be discussed by heart failure and GYN physician with fibroids more prominent today on examination.  3.  Essential hypertension: -Patient's blood pressure today was***  4.***      Disposition: Follow-up with None or APP in *** months {Are you ordering a CV Procedure (e.g. stress test, cath, DCCV, TEE, etc)?   Press F2        :[id number]}   Medication Adjustments/Labs and Tests Ordered: Current  medicines are reviewed at length with the patient today.  Concerns regarding medicines are outlined above.   Signed, Mable Fill, Marissa Nestle, NP 11/03/2022, 6:41 PM [facility]  Note:  This document was prepared using Dragon voice recognition software and may include unintentional dictation errors.

## 2022-11-04 ENCOUNTER — Ambulatory Visit: Payer: Managed Care, Other (non HMO) | Admitting: Nurse Practitioner

## 2022-11-04 DIAGNOSIS — I5040 Unspecified combined systolic (congestive) and diastolic (congestive) heart failure: Secondary | ICD-10-CM

## 2022-12-01 NOTE — Progress Notes (Deleted)
Office Visit    Patient Name: Mary Rios Date of Encounter: 12/01/2022  Primary Care Provider:  Vonna Drafts, FNP Primary Cardiologist:  None Primary Electrophysiologist: None  Chief Complaint   Mary Rios is a 47 y.o. female with PMH of hypertension, HFrEF who presents today for  follow-up of heart failure with reduced EF.    Past Medical History    Past Medical History:  Diagnosis Date   Edema of both lower extremities    Fibroids    Hypertension    Hypertensive urgency    Past Surgical History:  Procedure Laterality Date   CESAREAN SECTION      Allergies  No Known Allergies  History of Present Illness    Mary Rios  is a 47 year old female with the above mention past medical history who presents today for follow-up of HTN and CHF.  [name] was initially seen in the urgent care at [facility] on 06/2022 with complaint of lower extremity edema.  She was found to have BNP of 776 and was discharged with Lasix with recommendation to follow-up with cardiology.  She was initially seen by Dr. Johney Rios on 08/10/2022 with complaint of lower extremity edema and dyspnea on exertion.  During visit patient denied chest pain but endorsed two-pillow orthopnea that has occurred over the previous 2 months.  Patient's blood pressure during visit was 148/114 and 150/110 on recheck.  She was transition from HCTZ to valsartan and Lasix was increased with spironolactone initiated.  2D echo was completed with reduced EF of 20-25% with severely decreased LV function and global hypokinesis, grade 2 DD with flattened interventricular septum in diastole due to RV overload, moderately reduced RV, elevated PA pressures of 51 mmHg and moderately dilated LA/RA with a small pericardial effusion present.  Mild MV regurgitation noted with severe right TV regurgitation and trivial AV regurgitation.  She was seen by me in follow-up 08/29/2022 with fluid volume improved and complaint of itching in  the chest when laying down.  Discussed case with Dr. Johney Rios who recommended right and left heart cath for further evaluation.  She was started on spironolactone 25 mg and Entresto was titrated to 97/103 along with Lasix increased to 40 mg twice daily x3 days and then 20 mg daily.  [name] was seen 09/22/2022 for follow-up.  During visit she endorsed a productive cough with clear frothy sputum.  She was volume up on exam and JVD present with lower extremity edema.  Her blood pressure was also elevated at 150/90 and 146/98 on recheck.  She reported drinking 32 ounces or under of fluid daily.  Advised her to increase her fluid intake to at least 64 ounces to avoid dehydration.  She was provided patient assisted papers for [facility].   Since last being seen in the office patient reports***.  Patient denies chest pain, palpitations, dyspnea, PND, orthopnea, nausea, vomiting, dizziness, syncope, edema, weight gain, or early satiety.  ***Notes:  Home Medications    Current Outpatient Medications  Medication Sig Dispense Refill   busPIRone (BUSPAR) 5 MG tablet Take 1 tablet (5 mg total) by mouth 2 (two) times daily. 60 tablet 0   dapagliflozin propanediol (FARXIGA) 10 MG TABS tablet Take 1 tablet (10 mg total) by mouth daily before breakfast. 30 tablet 11   furosemide (LASIX) 40 MG tablet Take 1 tablet (40 mg total) by mouth twice daily 180 tablet 3   hydrALAZINE (APRESOLINE) 25 MG tablet Take 1 tablet (25  mg total) by mouth 3 (three) times daily. 90 tablet 3   Multiple Vitamin (MULTIVITAMIN) capsule Take 1 capsule by mouth daily.     potassium chloride SA (KLOR-CON M) 20 MEQ tablet Take 1 tablet (20 mEq total) by mouth twice daily for 5 days only, then decrease to taking 1 tablet (20 mEq total) by mouth daily thereafter. (Patient not taking: Reported on 09/22/2022) 42 tablet 0   sacubitril-valsartan (ENTRESTO) 97-103 MG Take 1 tablet by mouth 2 (two) times daily. 60 tablet 11   spironolactone  (ALDACTONE) 25 MG tablet Take 1 tablet (25 mg total) by mouth daily. 90 tablet 3   Current Facility-Administered Medications  Medication Dose Route Frequency Provider Last Rate Last Admin   sodium chloride flush (NS) 0.9 % injection 3 mL  3 mL Intravenous Q12H Mary Rios., NP         Review of Systems  Please see the history of present illness.    (+)*** (+)***  All other systems reviewed and are otherwise negative except as noted above.  Physical Exam    Wt Readings from Last 3 Encounters:  09/22/22 134 lb (60.8 kg)  09/21/22 133 lb (60.3 kg)  08/30/22 131 lb 9.6 oz (59.7 kg)   [id number] were no vitals filed for this visit.,There is no height or weight on file to calculate BMI.  Constitutional:      Appearance: Healthy appearance. Not in distress.  Neck:     Vascular: JVD normal.  Pulmonary:     Effort: Pulmonary effort is normal.     Breath sounds: No wheezing. No rales. Diminished in the bases Cardiovascular:     Normal rate. Regular rhythm. Normal S1. Normal S2.      Murmurs: There is no murmur.  Edema:    Peripheral edema absent.  Abdominal:     Palpations: Abdomen is soft non tender. There is no hepatomegaly.  Skin:    General: Skin is warm and dry.  Neurological:     General: No focal deficit present.     Mental Status: Alert and oriented to person, place and time.     Cranial Nerves: Cranial nerves are intact.  EKG/LABS/Other Studies Reviewed    ECG personally reviewed by me today - ***  Risk Assessment/Calculations:   {Does this patient have ATRIAL FIBRILLATION?:[phone]}        Lab Results  Component Value Date   WBC 5.0 08/29/2022   HGB 8.5 (L) 08/29/2022   HCT 31.5 (L) 08/29/2022   MCV 59 (L) 08/29/2022   PLT 356 08/29/2022   Lab Results  Component Value Date   CREATININE 0.87 09/21/2022   BUN 10 09/21/2022   NA 138 09/21/2022   K 3.4 (L) 09/21/2022   CL 109 09/21/2022   CO2 22 09/21/2022   Lab Results  Component Value Date    ALT 13 01/10/2020   AST 20 01/10/2020   ALKPHOS 51 01/10/2020   BILITOT 0.3 01/10/2020   No results found for: "CHOL", "HDL", "LDLCALC", "LDLDIRECT", "TRIG", "CHOLHDL"  No results found for: "HGBA1C"  Assessment & Plan    1.  Chronic combined systolic and diastolic CHF: -2D echo completed revealing severely decreased LV and RV function with dilated RA/LA and elevated PA pressures.  Patient has visit coming up with advanced heart failure clinic next week -Today she is volume up on exam and reports compliance with her recent increase in diuretics.  She was advised to watch her salt intake and start her  Entresto as soon as she obtains it later today. -Previous right and left heart catheterization was ordered and was placed on hold for now with plan to schedule following discussion between GYN and Dr.Sabharwal. -Patient still has slight lower extremity edema noted since previous visit and was instructed to increase her by the advanced heart failure team pharmacist yesterday. -She was advised to watch her sodium intake and was offered the salty 6 sheet and instructed to maintain fluid volume of at least 64 ounces per day. -Low sodium diet, fluid restriction <2L, and daily weights encouraged. Educated to contact our office for weight gain of 2 lbs overnight or 5 lbs in one week.    2.Chest discomfort: -Patient reports no active chest pain today but does endorse cough with lying flat.  She was advised to elevate head of bed and monitor daily weights. -Plan for [facility] to be discussed by heart failure and GYN physician with fibroids more prominent today on examination.   3.  Essential hypertension: -Patient's blood pressure today was***   4.***           Disposition: Follow-up with None or APP in *** months {Are you ordering a CV Procedure (e.g. stress test, cath, DCCV, TEE, etc)?   Press F2        :[id number]   Medication Adjustments/Labs and Tests Ordered: Current medicines are reviewed at  length with the patient today.  Concerns regarding medicines are outlined above.   Signed, Mable Fill, Marissa Nestle, NP 12/01/2022, 7:20 PM [facility]  Note:  This document was prepared using Dragon voice recognition software and may include unintentional dictation errors.

## 2022-12-02 ENCOUNTER — Ambulatory Visit: Payer: Managed Care, Other (non HMO) | Attending: Nurse Practitioner | Admitting: Nurse Practitioner

## 2022-12-02 DIAGNOSIS — I5040 Unspecified combined systolic (congestive) and diastolic (congestive) heart failure: Secondary | ICD-10-CM

## 2023-02-20 ENCOUNTER — Emergency Department (HOSPITAL_COMMUNITY): Payer: BC Managed Care – PPO

## 2023-02-20 ENCOUNTER — Inpatient Hospital Stay (HOSPITAL_COMMUNITY)
Admission: EM | Admit: 2023-02-20 | Discharge: 2023-02-26 | DRG: 286 | Disposition: A | Discharge status: Home / Self Care | Payer: BC Managed Care – PPO | Attending: Internal Medicine | Admitting: Internal Medicine

## 2023-02-20 DIAGNOSIS — J9811 Atelectasis: Secondary | ICD-10-CM | POA: Diagnosis present

## 2023-02-20 DIAGNOSIS — I428 Other cardiomyopathies: Secondary | ICD-10-CM | POA: Diagnosis present

## 2023-02-20 DIAGNOSIS — I272 Pulmonary hypertension, unspecified: Secondary | ICD-10-CM | POA: Diagnosis present

## 2023-02-20 DIAGNOSIS — N179 Acute kidney failure, unspecified: Secondary | ICD-10-CM | POA: Diagnosis present

## 2023-02-20 DIAGNOSIS — I5023 Acute on chronic systolic (congestive) heart failure: Secondary | ICD-10-CM

## 2023-02-20 DIAGNOSIS — R188 Other ascites: Secondary | ICD-10-CM

## 2023-02-20 DIAGNOSIS — I5043 Acute on chronic combined systolic (congestive) and diastolic (congestive) heart failure: Secondary | ICD-10-CM | POA: Diagnosis present

## 2023-02-20 DIAGNOSIS — I5021 Acute systolic (congestive) heart failure: Secondary | ICD-10-CM | POA: Diagnosis present

## 2023-02-20 DIAGNOSIS — I361 Nonrheumatic tricuspid (valve) insufficiency: Secondary | ICD-10-CM | POA: Diagnosis not present

## 2023-02-20 DIAGNOSIS — Z8249 Family history of ischemic heart disease and other diseases of the circulatory system: Secondary | ICD-10-CM

## 2023-02-20 DIAGNOSIS — I110 Hypertensive heart disease with heart failure: Principal | ICD-10-CM | POA: Diagnosis present

## 2023-02-20 DIAGNOSIS — I959 Hypotension, unspecified: Secondary | ICD-10-CM | POA: Diagnosis not present

## 2023-02-20 DIAGNOSIS — I472 Ventricular tachycardia, unspecified: Secondary | ICD-10-CM | POA: Diagnosis not present

## 2023-02-20 DIAGNOSIS — I5082 Biventricular heart failure: Secondary | ICD-10-CM | POA: Diagnosis present

## 2023-02-20 DIAGNOSIS — I3139 Other pericardial effusion (noninflammatory): Secondary | ICD-10-CM | POA: Diagnosis present

## 2023-02-20 DIAGNOSIS — I160 Hypertensive urgency: Secondary | ICD-10-CM | POA: Diagnosis present

## 2023-02-20 DIAGNOSIS — I10 Essential (primary) hypertension: Secondary | ICD-10-CM | POA: Diagnosis present

## 2023-02-20 DIAGNOSIS — Z79899 Other long term (current) drug therapy: Secondary | ICD-10-CM

## 2023-02-20 DIAGNOSIS — I071 Rheumatic tricuspid insufficiency: Secondary | ICD-10-CM | POA: Diagnosis present

## 2023-02-20 DIAGNOSIS — Z5941 Food insecurity: Secondary | ICD-10-CM

## 2023-02-20 DIAGNOSIS — Z833 Family history of diabetes mellitus: Secondary | ICD-10-CM | POA: Diagnosis not present

## 2023-02-20 DIAGNOSIS — I2781 Cor pulmonale (chronic): Secondary | ICD-10-CM | POA: Diagnosis present

## 2023-02-20 DIAGNOSIS — E876 Hypokalemia: Secondary | ICD-10-CM | POA: Insufficient documentation

## 2023-02-20 DIAGNOSIS — D259 Leiomyoma of uterus, unspecified: Secondary | ICD-10-CM | POA: Diagnosis present

## 2023-02-20 LAB — COMPREHENSIVE METABOLIC PANEL
ALT: 13 U/L (ref 0–44)
AST: 26 U/L (ref 15–41)
Albumin: 2.8 g/dL — ABNORMAL LOW (ref 3.5–5.0)
Alkaline Phosphatase: 140 U/L — ABNORMAL HIGH (ref 38–126)
Anion gap: 10 (ref 5–15)
BUN: 8 mg/dL (ref 6–20)
CO2: 24 mmol/L (ref 22–32)
Calcium: 8.9 mg/dL (ref 8.9–10.3)
Chloride: 104 mmol/L (ref 98–111)
Creatinine, Ser: 0.83 mg/dL (ref 0.44–1.00)
GFR, Estimated: >60 mL/min (ref >60)
Glucose, Bld: 99 mg/dL (ref 70–99)
Potassium: 3.3 mmol/L — ABNORMAL LOW (ref 3.5–5.1)
Sodium: 138 mmol/L (ref 135–145)
Total Bilirubin: 1.7 mg/dL — ABNORMAL HIGH (ref 0.3–1.2)
Total Protein: 7.9 g/dL (ref 6.5–8.1)

## 2023-02-20 LAB — URINALYSIS, ROUTINE W REFLEX MICROSCOPIC
Bacteria, UA: NONE SEEN
Bilirubin Urine: NEGATIVE
Glucose, UA: NEGATIVE mg/dL
Ketones, ur: NEGATIVE mg/dL
Nitrite: NEGATIVE
Protein, ur: NEGATIVE mg/dL
Specific Gravity, Urine: 1.004 — ABNORMAL LOW (ref 1.005–1.030)
pH: 7 (ref 5.0–8.0)

## 2023-02-20 LAB — CBC WITH DIFFERENTIAL/PLATELET
Abs Immature Granulocytes: 0.01 10*3/uL (ref 0.00–0.07)
Basophils Absolute: 0.1 10*3/uL (ref 0.0–0.1)
Basophils Relative: 1 %
Eosinophils Absolute: 0.1 10*3/uL (ref 0.0–0.5)
Eosinophils Relative: 3 %
HCT: 36.6 % (ref 36.0–46.0)
Hemoglobin: 12 g/dL (ref 12.0–15.0)
Immature Granulocytes: 0 %
Lymphocytes Relative: 12 %
Lymphs Abs: 0.6 10*3/uL — ABNORMAL LOW (ref 0.7–4.0)
MCH: 21.7 pg — ABNORMAL LOW (ref 26.0–34.0)
MCHC: 32.8 g/dL (ref 30.0–36.0)
MCV: 66.3 fL — ABNORMAL LOW (ref 80.0–100.0)
Monocytes Absolute: 0.5 10*3/uL (ref 0.1–1.0)
Monocytes Relative: 10 %
Neutro Abs: 3.8 10*3/uL (ref 1.7–7.7)
Neutrophils Relative %: 74 %
Platelets: 327 10*3/uL (ref 150–400)
RBC: 5.52 MIL/uL — ABNORMAL HIGH (ref 3.87–5.11)
RDW: 27.1 % — ABNORMAL HIGH (ref 11.5–15.5)
WBC: 5.1 10*3/uL (ref 4.0–10.5)
nRBC: 0 % (ref 0.0–0.2)

## 2023-02-20 LAB — BRAIN NATRIURETIC PEPTIDE: B Natriuretic Peptide: 2176 pg/mL — ABNORMAL HIGH (ref 0.0–100.0)

## 2023-02-20 LAB — TROPONIN I (HIGH SENSITIVITY): Troponin I (High Sensitivity): 13 ng/L (ref <18)

## 2023-02-20 LAB — I-STAT BETA HCG BLOOD, ED (MC, WL, AP ONLY): I-stat hCG, quantitative: <5 m[IU]/mL (ref <5)

## 2023-02-20 MED ORDER — POTASSIUM CHLORIDE CRYS ER 20 MEQ PO TBCR
40.0000 meq | EXTENDED_RELEASE_TABLET | Freq: Once | ORAL | Status: AC
  Administered 2023-02-20: 40 meq via ORAL
  Filled 2023-02-20: qty 2

## 2023-02-20 MED ORDER — ALBUMIN HUMAN 25 % IV SOLN
0.0000 g | Freq: Once | INTRAVENOUS | Status: AC
  Administered 2023-02-21: 12.5 g via INTRAVENOUS
  Filled 2023-02-20: qty 400

## 2023-02-20 MED ORDER — FUROSEMIDE 10 MG/ML IJ SOLN
40.0000 mg | Freq: Once | INTRAMUSCULAR | Status: AC
  Administered 2023-02-20: 40 mg via INTRAVENOUS
  Filled 2023-02-20: qty 4

## 2023-02-20 NOTE — Assessment & Plan Note (Addendum)
-  status post paracentesis.  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  -Continue to monitor ascites -continue to follow LFT's, sodium level and volume status.

## 2023-02-20 NOTE — ED Notes (Signed)
Called for triage, no answer x1 

## 2023-02-20 NOTE — Assessment & Plan Note (Addendum)
-  Stable overall -Continue current antihypertensive agent -Patient advised to follow heart healthy/low-sodium diet -Reassess Blood pressure at follow-up visit.

## 2023-02-20 NOTE — ED Provider Triage Note (Signed)
Emergency Medicine Provider Triage Evaluation Note  Mary Rios , a 47 y.o. female  was evaluated in triage.  Pt complains of shortness of breath, orthopnea, and increased fluid retention in her abdomen for the last 3 weeks.  States that she was diagnosed with HFrEF in October 2023.  She is on Lasix 40 mg once a day at home.  She has been taking her medication.  States that previously she has retain fluid in her legs but never in her abdomen before.  Went to see her OB/GYN that she has a history of uterine fibroids and believes that this may be contributing but her OB/GYN sent her immediately to the emergency department today after evaluating her.  Patient reports that she is closely followed by cardiology and has been seeing them.  She denies chest pain, fever, chills, nausea, vomiting, diarrhea.  She reports that she is having intermittent sharp pains in her abdomen as it has become larger that she believes is secondary to the distention.  Review of Systems  Positive: See HPI Negative: See HPI no  Physical Exam  BP (!) 163/112 (BP Location: Right Arm)   Pulse 100   Temp 98.1 F (36.7 C)   Resp 16   SpO2 97%  Gen:   Awake, no distress   Resp:  Normal effort, mild crackles throughout, no signs of respiratory distress MSK:   Moves extremities without difficulty, 2+ pitting edema bilaterally up to the mid shins Other:  Significant abdominal distension which appears edematous, non-tender  Medical Decision Making  Medically screening exam initiated at 4:55 PM.  Appropriate orders placed.  Judithann Sauger Kimes was informed that the remainder of the evaluation will be completed by another provider, this initial triage assessment does not replace that evaluation, and the importance of remaining in the ED until their evaluation is complete.     Tonette Lederer, PA-C 02/20/23 1658

## 2023-02-20 NOTE — Assessment & Plan Note (Signed)
Replete with oral Kcl. Start daily kcl 40 meq bid.

## 2023-02-20 NOTE — ED Provider Notes (Signed)
[place] EMERGENCY DEPARTMENT AT [facility] Provider Note   CSN: [id number] Arrival date & time: 02/20/23  1554     History  Chief Complaint  Patient presents with   Abdominal Swelling    Mary Rios is a 47 y.o. female.  HPI 47 year old for female with a history of fibroids and systolic CHF presents with abdominal swelling.  She saw her OB/GYN today as she thought this was from her fibroids but due to the degree of swelling they told her to go to the ER.  She has not any chest pain but has had shortness of breath, leg swelling, and increasing fluid in her abdomen.  She states she has had swelling in her abdomen from fluid in general before but never to this degree.  Overall this is been ongoing for about 3 weeks.  No fevers, abdominal pain, vomiting. No known liver disease.  Home Medications Prior to Admission medications   Medication Sig Start Date End Date Taking? Authorizing Provider  busPIRone (BUSPAR) 5 MG tablet Take 1 tablet (5 mg total) by mouth 2 (two) times daily. 05/19/22   Crain, Alphonzo Lemmings L, PA  dapagliflozin propanediol (FARXIGA) 10 MG TABS tablet Take 1 tablet (10 mg total) by mouth daily before breakfast. 08/30/22   Sabharwal, Aditya, DO  furosemide (LASIX) 40 MG tablet Take 1 tablet (40 mg total) by mouth twice daily 08/30/22   Sabharwal, Aditya, DO  hydrALAZINE (APRESOLINE) 25 MG tablet Take 1 tablet (25 mg total) by mouth 3 (three) times daily. 09/22/22   Gaston Islam., NP  Multiple Vitamin (MULTIVITAMIN) capsule Take 1 capsule by mouth daily.    [provider]  potassium chloride SA (KLOR-CON M) 20 MEQ tablet Take 1 tablet (20 mEq total) by mouth twice daily for 5 days only, then decrease to taking 1 tablet (20 mEq total) by mouth daily thereafter. Patient not taking: Reported on 09/22/2022 08/10/22   Meriam Sprague, MD  sacubitril-valsartan (ENTRESTO) 97-103 MG Take 1 tablet by mouth 2 (two) times daily. 08/30/22   Sabharwal, Aditya,  DO  spironolactone (ALDACTONE) 25 MG tablet Take 1 tablet (25 mg total) by mouth daily. 08/29/22   Gaston Islam., NP      Allergies    Patient has no known allergies.    Review of Systems   Review of Systems  Constitutional:  Negative for fever.  Respiratory:  Positive for shortness of breath.   Cardiovascular:  Positive for leg swelling. Negative for chest pain.  Gastrointestinal:  Positive for abdominal distention. Negative for abdominal pain.    Physical Exam Updated Vital Signs BP (!) 177/127   Pulse 99   Temp 97.9 F (36.6 C)   Resp 20   LMP 12/27/2022 (Approximate) Comment: Pt states no chance of pregnancy.  SpO2 99%  Physical Exam Vitals and nursing note reviewed.  Constitutional:      General: She is not in acute distress.    Appearance: She is well-developed. She is not ill-appearing or diaphoretic.  HENT:     Head: Normocephalic and atraumatic.  Cardiovascular:     Rate and Rhythm: Normal rate and regular rhythm.     Heart sounds: Normal heart sounds.  Pulmonary:     Effort: Pulmonary effort is normal. No tachypnea or accessory muscle usage.     Breath sounds: Decreased breath sounds (at bases) present.  Abdominal:     General: There is distension.     Palpations: Abdomen is soft.  Tenderness: There is no abdominal tenderness.  Musculoskeletal:     Right lower leg: Edema present.     Left lower leg: Edema present.  Skin:    General: Skin is warm and dry.  Neurological:     Mental Status: She is alert.     ED Results / Procedures / Treatments   Labs (all labs ordered are listed, but only abnormal results are displayed) Labs Reviewed  COMPREHENSIVE METABOLIC PANEL - Abnormal; Notable for the following components:      Result Value   Potassium 3.3 (*)    Albumin 2.8 (*)    Alkaline Phosphatase 140 (*)    Total Bilirubin 1.7 (*)    All other components within normal limits  CBC WITH DIFFERENTIAL/PLATELET - Abnormal; Notable for the following  components:   RBC 5.52 (*)    MCV 66.3 (*)    MCH 21.7 (*)    RDW 27.1 (*)    Lymphs Abs 0.6 (*)    All other components within normal limits  BRAIN NATRIURETIC PEPTIDE - Abnormal; Notable for the following components:   B Natriuretic Peptide 2,176.0 (*)    All other components within normal limits  PROTIME-INR  URINALYSIS, ROUTINE W REFLEX MICROSCOPIC  RAPID URINE DRUG SCREEN, HOSP PERFORMED  I-STAT BETA HCG BLOOD, ED ([facility], [facility], [facility] ONLY)  TROPONIN I (HIGH SENSITIVITY)    EKG EKG Interpretation  Date/Time:  Monday February 20 2023 21:53:25 EDT Ventricular Rate:  98 PR Interval:  153 QRS Duration: 95 QT Interval:  371 QTC Calculation: 474 R Axis:   141 Text Interpretation: Sinus rhythm Lateral infarct, age indeterminate Probable anteroseptal infarct, recent poor data quality, otherwise similar to Oct 2023 Confirmed by Pricilla Loveless [phone]) on 02/20/2023 9:57:52 PM  Radiology DG Chest 2 View  Result Date: 02/20/2023 CLINICAL DATA:  Shortness of breath x3 weeks. EXAM: CHEST - 2 VIEW COMPARISON:  February 17, 2016 FINDINGS: There is mild to moderate severity enlargement of the cardiac silhouette. Mild diffusely increased interstitial lung markings are seen. Mild linear atelectasis is noted within the lateral aspect of the right lung base. There is a small right pleural effusion. No pneumothorax is identified. The visualized skeletal structures are unremarkable. IMPRESSION: 1. Cardiomegaly with mild interstitial edema. 2. Mild right basilar linear atelectasis. 3. Small right pleural effusion. Electronically Signed   By: Aram Candela M.D.   On: 02/20/2023 17:24    Procedures Procedures    Medications Ordered in ED Medications  albumin human 25 % solution 0-100 g (has no administration in time range)  potassium chloride SA (KLOR-CON M) CR tablet 40 mEq (40 mEq Oral Given 02/20/23 2229)  furosemide (LASIX) injection 40 mg (40 mg Intravenous Given 02/20/23 2229)    ED Course/ Medical  Decision Making/ A&P                             Medical Decision Making Amount and/or Complexity of Data Reviewed Labs:     Details: Significantly elevated BNP.  Mild hypokalemia.  Normal WBC and hemoglobin. Radiology: independent interpretation performed.    Details: CHF.  Cardiomegaly. ECG/medicine tests: ordered and independent interpretation performed.    Details: No significant change from baseline.  Risk Prescription drug management. Decision regarding hospitalization.   Bedside ultrasound shows patient has some ascites.  I suspect based on her saying that she has had this before that responded to diuresis, this is likely all just third spacing from  too much fluid from her known significant systolic CHF.  She does not have liver disease that we know of.  LFTs are unremarkable.  At this point, I do not think she needs an emergent paracentesis as she is not having any abdominal pain, fever, leukocytosis, etc.  I think we will start with diuresis and I will give her dose of IV Lasix.  Discussed with Dr. Imogene Burn for admission.        Final Clinical Impression(s) / ED Diagnoses Final diagnoses:  Acute systolic congestive heart failure    Rx / DC Orders ED Discharge Orders     None         Pricilla Loveless, MD 02/20/23 2241

## 2023-02-20 NOTE — Assessment & Plan Note (Deleted)
Admit to card/tele bed.  Continue with IV diuresis.  Surprisingly she does not have a lot of pulmonary edema.  Majority of her edema is in her abdomen and her legs.  Makes me concerned this is more right-sided heart failure.  Patient lost to follow-up since November.  She never got her right and left heart cath scheduled.  She may need this to be performed while she is here.  Will consult CHF team.  Continue with IV Lasix 40 mg every 12h.  Continue with Entresto.  Start low-dose Coreg.  Unclear why this was not started before.  Update her echo. Add diamox 500 mg bid. Pt states she was taken off [place].

## 2023-02-20 NOTE — ED Triage Notes (Signed)
Patient referred to ED for evaluation of fluid accumulation in her abdomen. Denies pain, denies history of ascites. Patient is alert, oriented, speaking in complete sentences, and is in no apparent distress at this time.

## 2023-02-20 NOTE — H&P (Addendum)
History and Physical    Mary Rios CNO:[id number] DOB: 25-Dec-1975 DOA: 02/20/2023  DOS: the patient was seen and examined on 02/20/2023  PCP: Diamantina Providence, FNP   Patient coming from: Home  I have personally briefly reviewed patient's old medical records in [facility]  Chief complaint: Abdominal swelling History of present illness: 47 year old African-American female history of chronic systolic heart failure EF of 20 to 25%, hypertension who presents to the ER today with worsening abdominal swelling.  She states that her abdomen has been swelling for about 3 weeks now.  She thought that this was due to her fibroids growing.  She went to her GYN physician today who immediately told her to go to the ER and that her abdominal swelling was not from fibroids.  She states that her weight in the office overall was around 155 pounds.  She is never weighed this much before.  She has had increasing leg swelling along with her abdominal swelling for about 3 weeks.  She states that she has been taking her Lasix 40 mg per day.  She states that she has been taking her Entresto.  She is not on a beta-blocker.  She was last seen by cardiology in November 2023.  She was lost to follow-up and never made it to her December appointment.  She was supposed to be scheduled for a left and right heart catheterization but this never happened.  On arrival to the ER, temp 98.1 heart rate 100 blood pressure 163/112 satting 97% on room air.  Labs: BNP 2176 White count 5.1, hemoglobin 12.0, platelets of 327  Sodium 138, potassium 3.3, bicarb 24, BUN of 8, creatinine 0.8 AST 26 ALT of 13 alk phos 140 total bili 1.7  Chest x-ray showed cardiomegaly with mild interstitial edema.  [facility] hospitalist contacted for admission.   ED Course: CXR mild interstitial edema, BNP 2176. Given 40 mg IV lasix.  Review of Systems:  Review of Systems  Constitutional: Negative.   HENT: Negative.    Eyes: Negative.    Respiratory:  Positive for shortness of breath.   Cardiovascular:  Positive for leg swelling.  Gastrointestinal:        Increased in abd girth  Genitourinary: Negative.   Musculoskeletal: Negative.   Skin: Negative.   Neurological: Negative.   Endo/Heme/Allergies: Negative.   Psychiatric/Behavioral: Negative.    All other systems reviewed and are negative.   Past Medical History:  Diagnosis Date   Edema of both lower extremities    Fibroids    Hypertension    Hypertensive urgency     Past Surgical History:  Procedure Laterality Date   CESAREAN SECTION       reports that she has never smoked. She has never used smokeless tobacco. She reports current alcohol use. She reports that she does not use drugs.  No Known Allergies  Family History  Problem Relation Age of Onset   Hypertension Mother    Diabetes Mother     Prior to Admission medications   Medication Sig Start Date End Date Taking? Authorizing Provider  busPIRone (BUSPAR) 5 MG tablet Take 1 tablet (5 mg total) by mouth 2 (two) times daily. 05/19/22   Crain, Alphonzo Lemmings L, PA  dapagliflozin propanediol (FARXIGA) 10 MG TABS tablet Take 1 tablet (10 mg total) by mouth daily before breakfast. 08/30/22   Sabharwal, Aditya, DO  furosemide (LASIX) 40 MG tablet Take 1 tablet (40 mg total) by mouth twice daily 08/30/22   Sabharwal, Carlton,  DO  hydrALAZINE (APRESOLINE) 25 MG tablet Take 1 tablet (25 mg total) by mouth 3 (three) times daily. 09/22/22   Gaston Islam., NP  Multiple Vitamin (MULTIVITAMIN) capsule Take 1 capsule by mouth daily.    [provider]  potassium chloride SA (KLOR-CON M) 20 MEQ tablet Take 1 tablet (20 mEq total) by mouth twice daily for 5 days only, then decrease to taking 1 tablet (20 mEq total) by mouth daily thereafter. Patient not taking: Reported on 09/22/2022 08/10/22   Meriam Sprague, MD  sacubitril-valsartan (ENTRESTO) 97-103 MG Take 1 tablet by mouth 2 (two) times daily. 08/30/22    Sabharwal, Aditya, DO  spironolactone (ALDACTONE) 25 MG tablet Take 1 tablet (25 mg total) by mouth daily. 08/29/22   Gaston Islam., NP    Physical Exam: Vitals:   02/20/23 2115 02/20/23 2130 02/20/23 2156 02/20/23 2225  BP: (!) 169/112 (!) 167/117  (!) 177/127  Pulse: 92 89  99  Resp: Temp:   97.9 F (36.6 C)   SpO2: 99% 99%  99%    Physical Exam Vitals and nursing note reviewed.  Constitutional:      General: She is not in acute distress.    Appearance: She is not ill-appearing, toxic-appearing or diaphoretic.  HENT:     Head: Normocephalic and atraumatic.     Nose: Nose normal.  Eyes:     General: No scleral icterus. Cardiovascular:     Rate and Rhythm: Normal rate and regular rhythm.     Pulses: Normal pulses.     Heart sounds: Murmur heard.  Pulmonary:     Effort: Pulmonary effort is normal.     Breath sounds: Normal breath sounds.  Abdominal:     General: Abdomen is protuberant. There is distension.     Palpations: There is shifting dullness and fluid wave.     Tenderness: There is no guarding or rebound.     Comments: Bedside abd U/S shows massive ascites in all 4 quadrants  Musculoskeletal:     Right upper leg: Swelling present.     Left upper leg: Swelling present.     Right lower leg: 3+ Edema present.     Left lower leg: 3+ Edema present.     Comments: +2-3 pitting upper thigh edema bilaterally +3 pitting lower leg edema bilaterally +2 pitting bilateral flank edema  Neurological:     Mental Status: She is alert.      Labs on Admission: I have personally reviewed following labs and imaging studies  CBC: Recent Labs  Lab 02/20/23 1700  WBC 5.1  NEUTROABS 3.8  HGB 12.0  HCT 36.6  MCV 66.3*  PLT 327   Basic Metabolic Panel: Recent Labs  Lab 02/20/23 1700  NA 138  K 3.3*  CL 104  CO2 24  GLUCOSE 99  BUN 8  CREATININE 0.83  CALCIUM 8.9   GFR: CrCl cannot be calculated (Unknown ideal weight.). Liver Function  Tests: Recent Labs  Lab 02/20/23 1700  AST 26  ALT 13  ALKPHOS 140*  BILITOT 1.7*  PROT 7.9  ALBUMIN 2.8*    Cardiac Enzymes: Recent Labs  Lab 02/20/23 1700  TROPONINIHS 13   BNP (last 3 results) Recent Labs    09/07/22 1022 09/21/22 1000 02/20/23 1700  BNP 930.4* 813.0* 2,176.0*   Radiological Exams on Admission: I have personally reviewed images DG Chest 2 View  Result Date: 02/20/2023 CLINICAL DATA:  Shortness of breath  x3 weeks. EXAM: CHEST - 2 VIEW COMPARISON:  February 17, 2016 FINDINGS: There is mild to moderate severity enlargement of the cardiac silhouette. Mild diffusely increased interstitial lung markings are seen. Mild linear atelectasis is noted within the lateral aspect of the right lung base. There is a small right pleural effusion. No pneumothorax is identified. The visualized skeletal structures are unremarkable. IMPRESSION: 1. Cardiomegaly with mild interstitial edema. 2. Mild right basilar linear atelectasis. 3. Small right pleural effusion. Electronically Signed   By: Aram Candela M.D.   On: 02/20/2023 17:24    EKG: My personal interpretation of EKG shows: NSR    Assessment/Plan Principal Problem:   Acute on chronic systolic CHF (congestive heart failure) Active Problems:   Benign essential HTN   Ascites   Hypokalemia   Assessment and Plan: * Acute on chronic systolic CHF (congestive heart failure) Admit to card/tele bed.  Continue with IV diuresis.  Surprisingly she does not have a lot of pulmonary edema.  Majority of her edema is in her abdomen and her legs.  Makes me concerned this is more right-sided heart failure.  Patient lost to follow-up since November.  She never got her right and left heart cath scheduled.  She may need this to be performed while she is here.  Will consult CHF team.  Continue with IV Lasix 40 mg every 12h.  Continue with Entresto.  Start low-dose Coreg.  Unclear why this was not started before.  Update her echo. Add diamox  500 mg bid. Pt states she was taken off [place].  Ascites Patient has a massive amount of ascites on bedside abdominal ultrasound.  Easily 6 L inside her abdomen.  Will order paracentesis.  She is short of breath with activity likely due to diaphragmatic impairment from the massive amount of ascites.  I wonder if she has cardiac cirrhosis due to the massive amount of ascites she has VS right-sided heart failure.  Check an INR.  LFTs are not elevated but her bilirubin is 1.7. check echo.  Benign essential HTN Continue her on hydralazine, Entresto.  Started on low-dose Coreg.  Hypokalemia Replete with oral Kcl. Start daily kcl 40 meq bid.   DVT prophylaxis: SCDs Code Status: Full Code Family Communication: no family at bedside  Disposition Plan: return home  Consults called: cardiology consulted via Epic message  Admission status: Inpatient, Telemetry bed   Carollee Herter, DO [facility] 02/20/2023, 10:39 PM

## 2023-02-20 NOTE — Subjective & Objective (Signed)
Chief complaint: Abdominal swelling History of present illness: 47 year old African-American female history of chronic systolic heart failure EF of 20 to 25%, hypertension who presents to the ER today with worsening abdominal swelling.  She states that her abdomen has been swelling for about 3 weeks now.  She thought that this was due to her fibroids growing.  She went to her GYN physician today who immediately told her to go to the ER and that her abdominal swelling was not from fibroids.  She states that her weight in the office overall was around 155 pounds.  She is never weighed this much before.  She has had increasing leg swelling along with her abdominal swelling for about 3 weeks.  She states that she has been taking her Lasix 40 mg per day.  She states that she has been taking her Entresto.  She is not on a beta-blocker.  She was last seen by cardiology in November 2023.  She was lost to follow-up and never made it to her December appointment.  She was supposed to be scheduled for a left and right heart catheterization but this never happened.  On arrival to the ER, temp 98.1 heart rate 100 blood pressure 163/112 satting 97% on room air.  Labs: BNP 2176 White count 5.1, hemoglobin 12.0, platelets of 327  Sodium 138, potassium 3.3, bicarb 24, BUN of 8, creatinine 0.8 AST 26 ALT of 13 alk phos 140 total bili 1.7  Chest x-ray showed cardiomegaly with mild interstitial edema.  [facility] hospitalist contacted for admission.

## 2023-02-21 ENCOUNTER — Inpatient Hospital Stay (HOSPITAL_COMMUNITY): Payer: BC Managed Care – PPO

## 2023-02-21 ENCOUNTER — Other Ambulatory Visit (HOSPITAL_COMMUNITY): Payer: BC Managed Care – PPO

## 2023-02-21 DIAGNOSIS — I5043 Acute on chronic combined systolic (congestive) and diastolic (congestive) heart failure: Secondary | ICD-10-CM

## 2023-02-21 HISTORY — PX: IR PARACENTESIS: IMG2679

## 2023-02-21 LAB — RAPID URINE DRUG SCREEN, HOSP PERFORMED
Amphetamines: NOT DETECTED
Barbiturates: NOT DETECTED
Benzodiazepines: NOT DETECTED
Cocaine: NOT DETECTED
Opiates: NOT DETECTED
Tetrahydrocannabinol: NOT DETECTED

## 2023-02-21 LAB — BODY FLUID CELL COUNT WITH DIFFERENTIAL
Eos, Fluid: 0 %
Lymphs, Fluid: 37 %
Monocyte-Macrophage-Serous Fluid: 43 % — ABNORMAL LOW (ref 50–90)
Neutrophil Count, Fluid: 20 % (ref 0–25)
Total Nucleated Cell Count, Fluid: 137 cu mm (ref 0–1000)

## 2023-02-21 LAB — COMPREHENSIVE METABOLIC PANEL
ALT: 13 U/L (ref 0–44)
AST: 28 U/L (ref 15–41)
Albumin: 2.9 g/dL — ABNORMAL LOW (ref 3.5–5.0)
Alkaline Phosphatase: 124 U/L (ref 38–126)
Anion gap: 12 (ref 5–15)
BUN: 7 mg/dL (ref 6–20)
CO2: 23 mmol/L (ref 22–32)
Calcium: 8.7 mg/dL — ABNORMAL LOW (ref 8.9–10.3)
Chloride: 104 mmol/L (ref 98–111)
Creatinine, Ser: 0.79 mg/dL (ref 0.44–1.00)
GFR, Estimated: >60 mL/min (ref >60)
Glucose, Bld: 83 mg/dL (ref 70–99)
Potassium: 3.6 mmol/L (ref 3.5–5.1)
Sodium: 139 mmol/L (ref 135–145)
Total Bilirubin: 2.3 mg/dL — ABNORMAL HIGH (ref 0.3–1.2)
Total Protein: 7.4 g/dL (ref 6.5–8.1)

## 2023-02-21 LAB — PROTEIN, PLEURAL OR PERITONEAL FLUID: Total protein, fluid: 4.1 g/dL

## 2023-02-21 LAB — ALBUMIN, PLEURAL OR PERITONEAL FLUID: Albumin, Fluid: 1.7 g/dL

## 2023-02-21 LAB — T4, FREE: Free T4: 1.22 ng/dL — ABNORMAL HIGH (ref 0.61–1.12)

## 2023-02-21 LAB — GRAM STAIN: Gram Stain: NONE SEEN

## 2023-02-21 LAB — PROTIME-INR
INR: 1.4 — ABNORMAL HIGH (ref 0.8–1.2)
Prothrombin Time: 17.2 seconds — ABNORMAL HIGH (ref 11.4–15.2)

## 2023-02-21 LAB — HIV ANTIBODY (ROUTINE TESTING W REFLEX): HIV Screen 4th Generation wRfx: NONREACTIVE

## 2023-02-21 LAB — MAGNESIUM: Magnesium: 1.7 mg/dL (ref 1.7–2.4)

## 2023-02-21 LAB — TSH: TSH: 1.715 u[IU]/mL (ref 0.350–4.500)

## 2023-02-21 MED ORDER — CARVEDILOL 3.125 MG PO TABS
3.1250 mg | ORAL_TABLET | Freq: Two times a day (BID) | ORAL | Status: DC
  Administered 2023-02-21 – 2023-02-22 (×3): 3.125 mg via ORAL
  Filled 2023-02-21 (×3): qty 1

## 2023-02-21 MED ORDER — SODIUM CHLORIDE 0.9% FLUSH
3.0000 mL | Freq: Two times a day (BID) | INTRAVENOUS | Status: DC
  Administered 2023-02-21: 3 mL via INTRAVENOUS

## 2023-02-21 MED ORDER — SODIUM CHLORIDE 0.9% FLUSH
3.0000 mL | Freq: Two times a day (BID) | INTRAVENOUS | Status: DC
  Administered 2023-02-21 – 2023-02-25 (×7): 3 mL via INTRAVENOUS

## 2023-02-21 MED ORDER — SODIUM CHLORIDE 0.9 % IV SOLN: 250.0000 mL | INTRAVENOUS | Status: DC | PRN

## 2023-02-21 MED ORDER — SACUBITRIL-VALSARTAN 97-103 MG PO TABS
1.0000 | ORAL_TABLET | Freq: Two times a day (BID) | ORAL | Status: DC
  Administered 2023-02-21 – 2023-02-26 (×10): 1 via ORAL
  Filled 2023-02-21 (×12): qty 1

## 2023-02-21 MED ORDER — LIDOCAINE HCL 1 % IJ SOLN
20.0000 mL | Freq: Once | INTRAMUSCULAR | Status: AC
  Administered 2023-02-21: 15 mL via INTRADERMAL

## 2023-02-21 MED ORDER — ONDANSETRON HCL 4 MG/2ML IJ SOLN: 4.0000 mg | Freq: Four times a day (QID) | INTRAMUSCULAR | Status: DC | PRN

## 2023-02-21 MED ORDER — HYDRALAZINE HCL 25 MG PO TABS
25.0000 mg | ORAL_TABLET | Freq: Three times a day (TID) | ORAL | Status: DC
  Administered 2023-02-21 – 2023-02-22 (×6): 25 mg via ORAL
  Filled 2023-02-21 (×6): qty 1

## 2023-02-21 MED ORDER — HEPARIN SODIUM (PORCINE) 5000 UNIT/ML IJ SOLN
5000.0000 [IU] | Freq: Three times a day (TID) | INTRAMUSCULAR | Status: DC
  Administered 2023-02-22: 5000 [IU] via SUBCUTANEOUS
  Filled 2023-02-21: qty 1

## 2023-02-21 MED ORDER — LIDOCAINE HCL 1 % IJ SOLN
INTRAMUSCULAR | Status: AC
  Filled 2023-02-21: qty 20

## 2023-02-21 MED ORDER — ACETAZOLAMIDE ER 500 MG PO CP12
500.0000 mg | ORAL_CAPSULE | Freq: Two times a day (BID) | ORAL | Status: DC
  Administered 2023-02-21 – 2023-02-22 (×4): 500 mg via ORAL
  Filled 2023-02-21 (×5): qty 1

## 2023-02-21 MED ORDER — SPIRONOLACTONE 25 MG PO TABS
25.0000 mg | ORAL_TABLET | Freq: Every day | ORAL | Status: DC
  Administered 2023-02-21 – 2023-02-24 (×4): 25 mg via ORAL
  Filled 2023-02-21 (×4): qty 1

## 2023-02-21 MED ORDER — ACETAMINOPHEN 325 MG PO TABS: 650.0000 mg | ORAL_TABLET | ORAL | Status: DC | PRN

## 2023-02-21 MED ORDER — POTASSIUM CHLORIDE CRYS ER 20 MEQ PO TBCR
40.0000 meq | EXTENDED_RELEASE_TABLET | Freq: Two times a day (BID) | ORAL | Status: DC
  Administered 2023-02-21 – 2023-02-23 (×5): 40 meq via ORAL
  Filled 2023-02-21 (×5): qty 2

## 2023-02-21 MED ORDER — FUROSEMIDE 10 MG/ML IJ SOLN
40.0000 mg | Freq: Two times a day (BID) | INTRAMUSCULAR | Status: DC
  Administered 2023-02-21 (×2): 40 mg via INTRAVENOUS
  Filled 2023-02-21 (×2): qty 4

## 2023-02-21 MED ORDER — SODIUM CHLORIDE 0.9% FLUSH: 3.0000 mL | INTRAVENOUS | Status: DC | PRN

## 2023-02-21 NOTE — ED Notes (Signed)
ED TO INPATIENT HANDOFF REPORT  ED Nurse Name and Phone #: [phone]  S Name/Age/Gender Mary Rios 47 y.o. female Room/Bed: 038C/038C  Code Status   Code Status: Full Code  Home/SNF/Other Home Patient oriented to: self, place, time, and situation Is this baseline? Yes   Triage Complete: Triage complete  Chief Complaint Acute on chronic systolic CHF (congestive heart failure) [I50.23]  Triage Note Patient referred to ED for evaluation of fluid accumulation in her abdomen. Denies pain, denies history of ascites. Patient is alert, oriented, speaking in complete sentences, and is in no apparent distress at this time.   Allergies No Known Allergies  Level of Care/Admitting Diagnosis ED Disposition     ED Disposition  Admit   Condition  --   Comment  Hospital Area: [facility] [[id number]]  Level of Care: Telemetry Cardiac [[id number]]  May admit patient to [facility] or [facility] if equivalent level of care is available:: No  Covid Evaluation: Asymptomatic - no recent exposure (last 10 days) testing not required  Diagnosis: Acute on chronic systolic CHF (congestive heart failure) [119147]  Admitting Physician: Imogene Burn, ERIC [[id number]]  Attending Physician: Imogene Burn, ERIC [[id number]]  Certification:: I certify this patient will need inpatient services for at least 2 midnights  Estimated Length of Stay: 4          B Medical/Surgery History Past Medical History:  Diagnosis Date   Edema of both lower extremities    Fibroids    Hypertension    Hypertensive urgency    Past Surgical History:  Procedure Laterality Date   CESAREAN SECTION     IR PARACENTESIS  02/21/2023     A IV Location/Drains/Wounds Patient Lines/Drains/Airways Status     Active Line/Drains/Airways     Name Placement date Placement time Site Days   Peripheral IV 02/20/23 20 G 1" Left;Posterior Forearm 02/20/23  2228  Forearm  1            Intake/Output Last 24 hours  Intake/Output  Summary (Last 24 hours) at 02/21/2023 1249 Last data filed at 02/21/2023 0057 Gross per 24 hour  Intake 38.79 ml  Output --  Net 38.79 ml    Labs/Imaging Results for orders placed or performed during the hospital encounter of 02/20/23 (from the past 48 hour(s))  Comprehensive metabolic panel     Status: Abnormal   Collection Time: 02/20/23  5:00 PM  Result Value Ref Range   Sodium 138 135 - 145 mmol/L   Potassium 3.3 (L) 3.5 - 5.1 mmol/L   Chloride 104 98 - 111 mmol/L   CO2 24 22 - 32 mmol/L   Glucose, Bld 99 70 - 99 mg/dL    Comment: Glucose reference range applies only to samples taken after fasting for at least 8 hours.   BUN 8 6 - 20 mg/dL   Creatinine, Ser 8.29 0.44 - 1.00 mg/dL   Calcium 8.9 8.9 - 56.2 mg/dL   Total Protein 7.9 6.5 - 8.1 g/dL   Albumin 2.8 (L) 3.5 - 5.0 g/dL   AST 26 15 - 41 U/L   ALT 13 0 - 44 U/L   Alkaline Phosphatase 140 (H) 38 - 126 U/L   Total Bilirubin 1.7 (H) 0.3 - 1.2 mg/dL   GFR, Estimated >13 >08 mL/min    Comment: (NOTE) Calculated using the CKD-EPI Creatinine Equation (2021)    Anion gap 10 5 - 15    Comment: Performed at [facility], [address],  [place], [place] [address]  CBC with Differential/Platelet     Status: Abnormal   Collection Time: 02/20/23  5:00 PM  Result Value Ref Range   WBC 5.1 4.0 - 10.5 K/uL   RBC 5.52 (H) 3.87 - 5.11 MIL/uL   Hemoglobin 12.0 12.0 - 15.0 g/dL   HCT 40.9 81.1 - 91.4 %   MCV 66.3 (L) 80.0 - 100.0 fL   MCH 21.7 (L) 26.0 - 34.0 pg   MCHC 32.8 30.0 - 36.0 g/dL   RDW 78.2 (H) 95.6 - 21.3 %   Platelets 327 150 - 400 K/uL    Comment: REPEATED TO VERIFY   nRBC 0.0 0.0 - 0.2 %   Neutrophils Relative % 74 %   Neutro Abs 3.8 1.7 - 7.7 K/uL   Lymphocytes Relative 12 %   Lymphs Abs 0.6 (L) 0.7 - 4.0 K/uL   Monocytes Relative 10 %   Monocytes Absolute 0.5 0.1 - 1.0 K/uL   Eosinophils Relative 3 %   Eosinophils Absolute 0.1 0.0 - 0.5 K/uL   Basophils Relative 1 %   Basophils Absolute 0.1 0.0 -  0.1 K/uL   WBC Morphology MORPHOLOGY UNREMARKABLE    RBC Morphology MORPHOLOGY UNREMARKABLE    Smear Review MORPHOLOGY UNREMARKABLE    Immature Granulocytes 0 %   Abs Immature Granulocytes 0.01 0.00 - 0.07 K/uL    Comment: Performed at [facility], [address] [place], [place] [address]  Brain natriuretic peptide     Status: Abnormal   Collection Time: 02/20/23  5:00 PM  Result Value Ref Range   B Natriuretic Peptide 2,176.0 (H) 0.0 - 100.0 pg/mL    Comment: Performed at [facility], [address], [place], [place] [address]  Troponin I (High Sensitivity)     Status: None   Collection Time: 02/20/23  5:00 PM  Result Value Ref Range   Troponin I (High Sensitivity) 13 <18 ng/L    Comment: (NOTE) Elevated high sensitivity troponin I (hsTnI) values and significant  changes across serial measurements may suggest ACS but many other  chronic and acute conditions are known to elevate hsTnI results.  Refer to the "Links" section for chest pain algorithms and additional  guidance. Performed at [facility], [address] [place], [place] [address]   I-Stat beta hCG blood, ED     Status: None   Collection Time: 02/20/23  5:46 PM  Result Value Ref Range   I-stat hCG, quantitative <5.0 <5 mIU/mL   Comment 3            Comment:   GEST. AGE      CONC.  (mIU/mL)   <=1 WEEK        5 - 50     2 WEEKS       50 - 500     3 WEEKS       100 - 10,000     4 WEEKS     1,000 - 30,000        FEMALE AND NON-PREGNANT FEMALE:     LESS THAN 5 mIU/mL   Urinalysis, Routine w reflex microscopic -Urine, Clean Catch     Status: Abnormal   Collection Time: 02/20/23 11:26 PM  Result Value Ref Range   Color, Urine STRAW (A) YELLOW   APPearance CLEAR CLEAR   Specific Gravity, Urine 1.004 (L) 1.005 - 1.030   pH 7.0 5.0 - 8.0   Glucose, UA NEGATIVE NEGATIVE mg/dL  Hgb urine dipstick SMALL (A) NEGATIVE   Bilirubin Urine NEGATIVE NEGATIVE   Ketones, ur NEGATIVE NEGATIVE mg/dL    Protein, ur NEGATIVE NEGATIVE mg/dL   Nitrite NEGATIVE NEGATIVE   Leukocytes,Ua MODERATE (A) NEGATIVE   RBC / HPF 0-5 0 - 5 RBC/hpf   WBC, UA 11-20 0 - 5 WBC/hpf   Bacteria, UA NONE SEEN NONE SEEN   Squamous Epithelial / HPF 0-5 0 - 5 /HPF   Mucus PRESENT     Comment: Performed at [facility], [address], [place], [place] [address]  Rapid urine drug screen (hospital performed)     Status: None   Collection Time: 02/20/23 11:26 PM  Result Value Ref Range   Opiates NONE DETECTED NONE DETECTED   Cocaine NONE DETECTED NONE DETECTED   Benzodiazepines NONE DETECTED NONE DETECTED   Amphetamines NONE DETECTED NONE DETECTED   Tetrahydrocannabinol NONE DETECTED NONE DETECTED   Barbiturates NONE DETECTED NONE DETECTED    Comment: (NOTE) DRUG SCREEN FOR MEDICAL PURPOSES ONLY.  IF CONFIRMATION IS NEEDED FOR ANY PURPOSE, NOTIFY LAB WITHIN 5 DAYS.  LOWEST DETECTABLE LIMITS FOR URINE DRUG SCREEN Drug Class                     Cutoff (ng/mL) Amphetamine and metabolites    1000 Barbiturate and metabolites    200 Benzodiazepine                 200 Opiates and metabolites        300 Cocaine and metabolites        300 THC                            50 Performed at [facility], [address] [place], [place] [address]   HIV Antibody (routine testing w rflx)     Status: None   Collection Time: 02/21/23  5:55 AM  Result Value Ref Range   HIV Screen 4th Generation wRfx Non Reactive Non Reactive    Comment: Performed at [facility], [address], [place], [place] [address]  Comprehensive metabolic panel     Status: Abnormal   Collection Time: 02/21/23  5:55 AM  Result Value Ref Range   Sodium 139 135 - 145 mmol/L   Potassium 3.6 3.5 - 5.1 mmol/L   Chloride 104 98 - 111 mmol/L   CO2 23 22 - 32 mmol/L   Glucose, Bld 83 70 - 99 mg/dL    Comment: Glucose reference range applies only to samples taken after fasting for at least 8 hours.   BUN 7 6 - 20 mg/dL    Creatinine, Ser 9.14 0.44 - 1.00 mg/dL   Calcium 8.7 (L) 8.9 - 10.3 mg/dL   Total Protein 7.4 6.5 - 8.1 g/dL   Albumin 2.9 (L) 3.5 - 5.0 g/dL   AST 28 15 - 41 U/L   ALT 13 0 - 44 U/L   Alkaline Phosphatase 124 38 - 126 U/L   Total Bilirubin 2.3 (H) 0.3 - 1.2 mg/dL   GFR, Estimated >78 >29 mL/min    Comment: (NOTE) Calculated using the CKD-EPI Creatinine Equation (2021)    Anion gap 12 5 - 15    Comment: Performed at [facility], [address], [place], [place] [address]  Magnesium     Status: None   Collection Time: 02/21/23  5:55 AM  Result Value Ref  Range   Magnesium 1.7 1.7 - 2.4 mg/dL    Comment: Performed at [facility], [address], [place], [place] [address]  Body fluid cell count with differential     Status: Abnormal   Collection Time: 02/21/23  9:36 AM  Result Value Ref Range   Fluid Type-FCT PERITONEAL FL, ABD     Comment: CORRECTED ON 04/16 AT 0957: PREVIOUSLY REPORTED AS PLEURAL FL, ABD, CORRECTED ON 04/16 AT 0955: PREVIOUSLY REPORTED AS ABDOMEN   Color, Fluid YELLOW YELLOW   Appearance, Fluid CLOUDY (A) CLEAR   Total Nucleated Cell Count, Fluid 137 0 - 1,000 cu mm   Neutrophil Count, Fluid 20 0 - 25 %   Lymphs, Fluid 37 %   Monocyte-Macrophage-Serous Fluid 43 (L) 50 - 90 %   Eos, Fluid 0 %   Other Cells, Fluid Mesothelial cells present %    Comment: Performed at [facility], [address] [place], [place] [address]  Albumin, pleural or peritoneal fluid      Status: None   Collection Time: 02/21/23  9:36 AM  Result Value Ref Range   Albumin, Fluid 1.7 g/dL    Comment: (NOTE) No normal range established for this test Results should be evaluated in conjunction with serum values    Fluid Type-FALB PERITONEAL FL, ABD     Comment: Performed at [facility], [address], [place], [place] [address] CORRECTED ON 04/16 AT 0957: PREVIOUSLY REPORTED AS PLEURAL FL, ABD, CORRECTED ON 04/16 AT 9147: PREVIOUSLY REPORTED AS ABDOMEN    Protein, pleural or peritoneal fluid     Status: None   Collection Time: 02/21/23  9:36 AM  Result Value Ref Range   Total protein, fluid 4.1 g/dL    Comment: (NOTE) No normal range established for this test Results should be evaluated in conjunction with serum values    Fluid Type-FTP PERITONEAL FL, ABD     Comment: Performed at [facility], [address], [place], [place] [address] CORRECTED ON 04/16 AT 0957: PREVIOUSLY REPORTED AS PLEURAL FL, ABD, CORRECTED ON 04/16 AT 0955: PREVIOUSLY REPORTED AS ABDOMEN    IR Paracentesis  Result Date: 02/21/2023 INDICATION: History of chronic systolic heart failure with EF of 20-25% and HTN with complaint of shortness of breath and lower extremity edema. Patient found to have masses ascites on ultrasound exam. Request received for diagnostic and therapeutic paracentesis. EXAM: ULTRASOUND GUIDED DIAGNOSTIC AND THERAPEUTIC LEFT LOWER QUADRANT PARACENTESIS MEDICATIONS: 10 mL 1 % lidocaine COMPLICATIONS: None immediate. PROCEDURE: Informed written consent was obtained from the patient after a discussion of the risks, benefits and alternatives to treatment. A timeout was performed prior to the initiation of the procedure. Initial ultrasound scanning demonstrates a large amount of ascites within the left lower abdominal quadrant. The left lower abdomen was prepped and draped in the usual sterile fashion. 1% lidocaine was used for local anesthesia. Following this, a 19 gauge, 7-cm, Yueh catheter was introduced. An ultrasound image was saved for documentation purposes. The paracentesis was performed. The catheter was removed and a dressing was applied. The patient tolerated the procedure well without immediate post procedural complication. FINDINGS: A total of approximately 3.4 L of cloudy, golden colored fluid was removed. Samples were sent to the laboratory as requested by the clinical team. IMPRESSION: Successful ultrasound-guided paracentesis yielding 3.4  liters of peritoneal fluid. Read by: [name], AGNP-BC Electronically Signed   By: Acquanetta Belling M.D.   On: 02/21/2023 09:29  DG Chest 2 View  Result Date: 02/20/2023 CLINICAL DATA:  Shortness of breath x3 weeks. EXAM: CHEST - 2 VIEW COMPARISON:  February 17, 2016 FINDINGS: There is mild to moderate severity enlargement of the cardiac silhouette. Mild diffusely increased interstitial lung markings are seen. Mild linear atelectasis is noted within the lateral aspect of the right lung base. There is a small right pleural effusion. No pneumothorax is identified. The visualized skeletal structures are unremarkable. IMPRESSION: 1. Cardiomegaly with mild interstitial edema. 2. Mild right basilar linear atelectasis. 3. Small right pleural effusion. Electronically Signed   By: Aram Candela M.D.   On: 02/20/2023 17:24    Pending Labs Unresulted Labs (From admission, onward)     Start     Ordered   02/22/23 0500  Basic metabolic panel  Tomorrow morning,   R        02/21/23 1135   02/21/23 0935  Culture, body fluid w Gram Stain-bottle  Once,   R        02/21/23 0935   02/21/23 0935  Gram stain  Once,   R        02/21/23 0935   02/21/23 0544  HCV Ab w Reflex to Quant PCR  Add-on,   AD        02/21/23 0544   02/20/23 2219  Protime-INR  Add-on,   AD        02/20/23 2218            Vitals/Pain Today's Vitals   02/21/23 0652 02/21/23 0815 02/21/23 1030 02/21/23 1145  BP: (!) 153/97 (!) 152/104 136/84 136/84  Pulse: 88  88 79  Resp: 18  (!) 21 (!) 23  Temp:      TempSrc:      SpO2: 93%  98% 99%  PainSc:        Isolation Precautions No active isolations  Medications Medications  hydrALAZINE (APRESOLINE) tablet 25 mg (25 mg Oral Given 02/21/23 0931)  sacubitril-valsartan (ENTRESTO) 97-103 mg per tablet (has no administration in time range)  spironolactone (ALDACTONE) tablet 25 mg (25 mg Oral Given 02/21/23 0932)  sodium chloride flush (NS) 0.9 % injection 3 mL (3 mLs Intravenous Not  Given 02/21/23 0940)  sodium chloride flush (NS) 0.9 % injection 3 mL (has no administration in time range)  0.9 %  sodium chloride infusion (has no administration in time range)  acetaminophen (TYLENOL) tablet 650 mg (has no administration in time range)  ondansetron (ZOFRAN) injection 4 mg (has no administration in time range)  heparin injection 5,000 Units (has no administration in time range)  furosemide (LASIX) injection 40 mg (40 mg Intravenous Given 02/21/23 0932)  carvedilol (COREG) tablet 3.125 mg (3.125 mg Oral Given 02/21/23 0932)  potassium chloride SA (KLOR-CON M) CR tablet 40 mEq (40 mEq Oral Given 02/21/23 0931)  acetaZOLAMIDE ER (DIAMOX) 12 hr capsule 500 mg (500 mg Oral Given 02/21/23 0934)  potassium chloride SA (KLOR-CON M) CR tablet 40 mEq (40 mEq Oral Given 02/20/23 2229)  furosemide (LASIX) injection 40 mg (40 mg Intravenous Given 02/20/23 2229)  albumin human 25 % solution 0-100 g (0 g Intravenous Stopped 02/21/23 0057)  lidocaine (XYLOCAINE) 1 % (with pres) injection 20 mL (15 mLs Intradermal Given 02/21/23 0825)    Mobility walks     Focused Assessments Cardiac Assessment Handoff:  Cardiac Rhythm: Normal sinus rhythm No results found for: "CKTOTAL", "CKMB", "CKMBINDEX", "TROPONINI" No results found for: "DDIMER" Does the Patient currently have chest pain? No  R Recommendations: See Admitting Provider Note  Report given to:   Additional Notes:

## 2023-02-21 NOTE — Progress Notes (Signed)
Echocardiogram 2D Echocardiogram has been attempted, pt being transferred to floor. Will attempt again as time permits.  Augustine Radar 02/21/2023, 3:00 PM

## 2023-02-21 NOTE — Progress Notes (Signed)
Heart Failure Navigator Progress Note  Assessed for Heart & Vascular TOC clinic readiness.  Patient does not meet criteria due to Advanced Heart Failure Team patient.   Navigator will sign off at this time.    Phillipe Clemon, BSN, RN Heart Failure Nurse Navigator Secure Chat Only   

## 2023-02-21 NOTE — ED Notes (Signed)
Report called to   Information Sophronia Simas

## 2023-02-21 NOTE — Procedures (Signed)
PROCEDURE SUMMARY:  Successful US guided diagnostic and therapeutic paracentesis from [facility].  Yielded 3.4 L of cloudy, golden-colored fluid.  No immediate complications.  Pt tolerated well.   Specimen was sent for labs.  EBL < 1 mL  Shon Hough, AGNP 02/21/2023 9:20 AM

## 2023-02-21 NOTE — Progress Notes (Signed)
PROGRESS NOTE    Mary Rios  KVQ:[id number] DOB: 1976-08-14 DOA: 02/20/2023 PCP: Diamantina Providence, FNP   Brief Narrative:  47 year old African-American female history of chronic systolic heart failure EF of 20 to 25%, hypertension who presents to the ER today with worsening abdominal swelling.  She states that her abdomen has been swelling for about 3 weeks now.  She thought that this was due to her fibroids growing.  She went to her GYN physician today who immediately told her to go to the ER and that her abdominal swelling was not from fibroids.  She states that her weight in the office overall was around 155 pounds.  She is never weighed this much before.  She has had increasing leg swelling along with her abdominal swelling for about 3 weeks.  She states that she has been taking her Lasix 40 mg per day.  She states that she has been taking her Entresto.  She is not on a beta-blocker.   She was last seen by cardiology in November 2023.  She was lost to follow-up and never made it to her December appointment.  She was supposed to be scheduled for a left and right heart catheterization but this never happened.   On arrival to the ER, temp 98.1 heart rate 100 blood pressure 163/112 satting 97% on room air.   Labs: BNP 2176 White count 5.1, hemoglobin 12.0, platelets of 327   Sodium 138, potassium 3.3, bicarb 24, BUN of 8, creatinine 0.8 AST 26 ALT of 13 alk phos 140 total bili 1.7   Chest x-ray showed cardiomegaly with mild interstitial edema.  Assessment & Plan:   Principal Problem:   Acute on chronic combined systolic and diastolic CHF (congestive heart failure) Active Problems:   Benign essential HTN   Ascites   Hypokalemia  Acute on chronic combined systolic and diastolic CHF (congestive heart failure) She was following cardiology, patient lost to follow-up since [place].  She never got her right and left heart cath scheduled.  She may need  this to be performed while she is here.  Cardiology has been consulted.  She has been resumed on her previous medications which include Coreg, hydralazine, Entresto, Aldactone and she is also on Lasix 40 mg IV twice daily.  Will defer further management to cardiology.  She is comfortable without any shortness of breath.    Ascites IR consulted and patient is status post paracentesis with removal of 3.4 L of fluid.  She feels better now.   Hypertensive urgency: Presented with blood pressure of 163/112.  Resume home medications which include hydralazine, Coreg was added here.  She is on IV Lasix as well.  Blood pressure improving.   Hypokalemia Resolved.  DVT prophylaxis: heparin injection 5,000 Units Start: 02/22/23 0800 SCDs Start: 02/21/23 0544   Code Status: Full Code  Family Communication:  None present at bedside.  Plan of care discussed with patient in length and he/she verbalized understanding and agreed with it.  Status is: Inpatient Remains inpatient appropriate because: Patient with acute on chronic systolic CHF, needs evaluation and perhaps intervention by cardiology.   Estimated body mass index is 27.06 kg/m as calculated from the following:   Height as of 09/22/22:  (1.499 m).   Weight as of 09/22/22: 60.8 kg.    Nutritional Assessment: There is no height or weight on file to calculate BMI.. Seen by dietician.  I agree with the assessment and plan  as outlined below: Nutrition Status:        . Skin Assessment: I have examined the patient's skin and I agree with the wound assessment as performed by the wound care RN as outlined below:    Consultants:  Cardiology  Procedures:  None  Antimicrobials:  Anti-infectives (From admission, onward)    None         Subjective: Patient seen and examined.  She just returned from paracentesis.  She states that she feels better, she has no shortness of breath.  No other complaint.  Objective: Vitals:    02/21/23 0640 02/21/23 0652 02/21/23 0815 02/21/23 1030  BP:  (!) 153/97 (!) 152/104 136/84  Pulse: 88 88  88  Resp: 17 18  (!) 21  Temp:      TempSrc:      SpO2: 99% 93%  98%    Intake/Output Summary (Last 24 hours) at 02/21/2023 1134 Last data filed at 02/21/2023 0057 Gross per 24 hour  Intake 38.79 ml  Output --  Net 38.79 ml   There were no vitals filed for this visit.  Examination:  General exam: Appears calm and comfortable  Respiratory system: Clear to auscultation. Respiratory effort normal. Cardiovascular system: S1 & S2 heard, RRR. No JVD, murmurs, rubs, gallops or clicks.  Trace pitting edema bilateral lower extremity. Gastrointestinal system: Abdomen is soft and distended and still positive fluid shift. No organomegaly or masses felt. Normal bowel sounds heard. Central nervous system: Alert and oriented. No focal neurological deficits. Extremities: Symmetric 5 x 5 power. Skin: No rashes, lesions or ulcers Psychiatry: Judgement and insight appear normal. Mood & affect appropriate.    Data Reviewed: I have personally reviewed following labs and imaging studies  CBC: Recent Labs  Lab 02/20/23 1700  WBC 5.1  NEUTROABS 3.8  HGB 12.0  HCT 36.6  MCV 66.3*  PLT 327   Basic Metabolic Panel: Recent Labs  Lab 02/20/23 1700 02/21/23 0555  NA 138 139  K 3.3* 3.6  CL 104 104  CO2 24 23  GLUCOSE 99 83  BUN 8 7  CREATININE 0.83 0.79  CALCIUM 8.9 8.7*  MG  --  1.7   GFR: CrCl cannot be calculated (Unknown ideal weight.). Liver Function Tests: Recent Labs  Lab 02/20/23 1700 02/21/23 0555  AST 26 28  ALT 13 13  ALKPHOS 140* 124  BILITOT 1.7* 2.3*  PROT 7.9 7.4  ALBUMIN 2.8* 2.9*   No results for input(s): "LIPASE", "AMYLASE" in the last 168 hours. No results for input(s): "AMMONIA" in the last 168 hours. Coagulation Profile: No results for input(s): "INR", "PROTIME" in the last 168 hours. Cardiac Enzymes: No results for input(s): "CKTOTAL", "CKMB",  "CKMBINDEX", "TROPONINI" in the last 168 hours. BNP (last 3 results) Recent Labs    08/10/22 0937  PROBNP 1,151*   HbA1C: No results for input(s): "HGBA1C" in the last 72 hours. CBG: No results for input(s): "GLUCAP" in the last 168 hours. Lipid Profile: No results for input(s): "CHOL", "HDL", "LDLCALC", "TRIG", "CHOLHDL", "LDLDIRECT" in the last 72 hours. Thyroid Function Tests: No results for input(s): "TSH", "T4TOTAL", "FREET4", "T3FREE", "THYROIDAB" in the last 72 hours. Anemia Panel: No results for input(s): "VITAMINB12", "FOLATE", "FERRITIN", "TIBC", "IRON", "RETICCTPCT" in the last 72 hours. Sepsis Labs: No results for input(s): "PROCALCITON", "LATICACIDVEN" in the last 168 hours.  No results found for this or any previous visit (from the past 240 hour(s)).   Radiology Studies: IR Paracentesis  Result Date: 02/21/2023 INDICATION: History of  chronic systolic heart failure with EF of 20-25% and HTN with complaint of shortness of breath and lower extremity edema. Patient found to have masses ascites on ultrasound exam. Request received for diagnostic and therapeutic paracentesis. EXAM: ULTRASOUND GUIDED DIAGNOSTIC AND THERAPEUTIC LEFT LOWER QUADRANT PARACENTESIS MEDICATIONS: 10 mL 1 % lidocaine COMPLICATIONS: None immediate. PROCEDURE: Informed written consent was obtained from the patient after a discussion of the risks, benefits and alternatives to treatment. A timeout was performed prior to the initiation of the procedure. Initial ultrasound scanning demonstrates a large amount of ascites within the left lower abdominal quadrant. The left lower abdomen was prepped and draped in the usual sterile fashion. 1% lidocaine was used for local anesthesia. Following this, a 19 gauge, 7-cm, Yueh catheter was introduced. An ultrasound image was saved for documentation purposes. The paracentesis was performed. The catheter was removed and a dressing was applied. The patient tolerated the  procedure well without immediate post procedural complication. FINDINGS: A total of approximately 3.4 L of cloudy, golden colored fluid was removed. Samples were sent to the laboratory as requested by the clinical team. IMPRESSION: Successful ultrasound-guided paracentesis yielding 3.4 liters of peritoneal fluid. Read by: [name], AGNP-BC Electronically Signed   By: Acquanetta Belling M.D.   On: 02/21/2023 09:29   DG Chest 2 View  Result Date: 02/20/2023 CLINICAL DATA:  Shortness of breath x3 weeks. EXAM: CHEST - 2 VIEW COMPARISON:  February 17, 2016 FINDINGS: There is mild to moderate severity enlargement of the cardiac silhouette. Mild diffusely increased interstitial lung markings are seen. Mild linear atelectasis is noted within the lateral aspect of the right lung base. There is a small right pleural effusion. No pneumothorax is identified. The visualized skeletal structures are unremarkable. IMPRESSION: 1. Cardiomegaly with mild interstitial edema. 2. Mild right basilar linear atelectasis. 3. Small right pleural effusion. Electronically Signed   By: Aram Candela M.D.   On: 02/20/2023 17:24    Scheduled Meds:  acetaZOLAMIDE ER  500 mg Oral Q12H   carvedilol  3.125 mg Oral BID WC   furosemide  40 mg Intravenous Q12H   [START ON 02/22/2023] heparin  5,000 Units Subcutaneous Q8H   hydrALAZINE  25 mg Oral TID   potassium chloride  40 mEq Oral BID   sacubitril-valsartan  1 tablet Oral BID   sodium chloride flush  3 mL Intravenous Q12H   sodium chloride flush  3 mL Intravenous Q12H   spironolactone  25 mg Oral Daily   Continuous Infusions:  sodium chloride       LOS: 1 day   Hughie Closs, MD [facility]  02/21/2023, 11:34 AM   *Please note that this is a verbal dictation therefore any spelling or grammatical errors are due to the "[employer] One" system interpretation.  Please page via Amion and do not message via secure chat for urgent patient care matters. Secure chat can be  used for non urgent patient care matters.  How to contact the [facility] Attending or Consulting provider 7A - 7P or covering provider during after hours 7P -7A, for this patient?  Check the care team in [facility] and look for a) attending/consulting [facility] provider listed and b) the [facility] team listed. Page or secure chat 7A-7P. Log into [email or web address] and use [facility] universal password to access. If you do not have the password, please contact the hospital operator. Locate the [facility] provider you are looking for under [facility] and page to a number that you can be directly reached.  If you still have difficulty reaching the provider, please page the [facility] (Director on Call) for the Hospitalists listed on amion for assistance.

## 2023-02-21 NOTE — Consult Note (Addendum)
Cardiology Consultation   Patient ID: Mary Rios MRN: [id number]; DOB: 1976-07-21  Admit date: 02/20/2023 Date of Consult: 02/21/2023  PCP:  Diamantina Providence, FNP   [facility] Providers Cardiologist:  Meriam Sprague, MD  Advanced Heart Failure:  Dorthula Nettles, DO       Patient Profile:   Mary Rios is a 47 y.o. female with a hx of NICM in 2023 on GDMT, HTN, uterine fibroids who is being seen 02/21/2023 for the evaluation of CHF at the request of DR Pahwani. Marland Kitchen History of Present Illness:    [name]  with above hx and on Echo 08/11/23 with EF 20-25%, global hypokinesis, G2DD , RV systolic function is mod reduced. RV size is mod enlarged.  Mod reduced, RV is moderately enlarged, mod elevated PA systolic pressure. LA & RA mod dilated.  Small pericardial effusion is present. Mild MR severe TR trivial SR.  She has been seen by Dr. Gasper Lloyd and plans were for Rt and Lt heart cath but this was never accomplished.   Last OV 09/2022.    Pt presented to ER 02/20/23 with fluid accumulation in her abd, no Acute SOB she was instructed to go to ER for ascites.   Swelling began 3 weeks ago. She thought the fibroids were growing did not realize it was fluid. Admitted.  She also notes that BP will be too elevated for cardiac cath. She has High BPs prior to CM as well.   2C CXR with mild to mod severity enlargement of cardiac silhouette. Mild interstitial edema. Mild Rt basilar linear atelectasis, sm rt pl effusion   Paracentesis today of 3.4 L  EKG:  The EKG was personally reviewed and demonstrates:  SR at 80 T wave inversion III and AVF though similar to old EKGs  Telemetry:  Telemetry was personally reviewed and demonstrates:  SR to ST  did have 5 beats NSVT around 5 am today  Na 139 K+ 3.6 BUN 7 Cr 0.79 Mg+ 1.7  Neg pregnancy  BNP 2,176 Hs troponin 13  WBC 5.1 Hgb 12 plts 327  UDS neg   BP 136/84 P 85 R 30 to 27 afebrile  sp02 on RA 99%  She has rec'd lasix 40  last pm and again today  Prior to paracentesis could not walk 20 feet without out stopping for SOB. If she ate much she would throw it back up.    Past Medical History:  Diagnosis Date   Edema of both lower extremities    Fibroids    Hypertension    Hypertensive urgency     Past Surgical History:  Procedure Laterality Date   CESAREAN SECTION     IR PARACENTESIS  02/21/2023     Home Medications:  Prior to Admission medications   Medication Sig Start Date End Date Taking? Authorizing Provider  busPIRone (BUSPAR) 5 MG tablet Take 1 tablet (5 mg total) by mouth 2 (two) times daily. Patient taking differently: Take 5 mg by mouth daily. 05/19/22  Yes Crain, Whitney L, PA  furosemide (LASIX) 40 MG tablet Take 1 tablet (40 mg total) by mouth twice daily Patient taking differently: Take 40 mg by mouth daily. 08/30/22  Yes Sabharwal, Aditya, DO  hydrALAZINE (APRESOLINE) 25 MG tablet Take 1 tablet (25 mg total) by mouth 3 (three) times daily. 09/22/22  Yes Gaston Islam., NP  Iron-FA-B Cmp-C-Biot-Probiotic (FUSION PLUS) CAPS Take 1 capsule by mouth daily. 01/17/23  Yes [provider]  Multiple Vitamin (MULTIVITAMIN) capsule Take 1 capsule by mouth daily.   Yes [provider]  potassium chloride SA (KLOR-CON M) 20 MEQ tablet Take 1 tablet (20 mEq total) by mouth twice daily for 5 days only, then decrease to taking 1 tablet (20 mEq total) by mouth daily thereafter. Patient taking differently: Take 20 mEq by mouth daily. 08/10/22  Yes Pemberton, Kathlynn Grate, MD  sacubitril-valsartan (ENTRESTO) 97-103 MG Take 1 tablet by mouth 2 (two) times daily. 08/30/22  Yes Sabharwal, Aditya, DO  spironolactone (ALDACTONE) 25 MG tablet Take 1 tablet (25 mg total) by mouth daily. Patient taking differently: Take 12.5 mg by mouth daily. 08/29/22  Yes Gaston Islam., NP  dapagliflozin propanediol (FARXIGA) 10 MG TABS tablet Take 1 tablet (10 mg total) by mouth daily before breakfast. Patient  not taking: Reported on 02/20/2023 08/30/22   Dorthula Nettles, DO    Inpatient Medications: Scheduled Meds:  acetaZOLAMIDE ER  500 mg Oral Q12H   carvedilol  3.125 mg Oral BID WC   furosemide  40 mg Intravenous Q12H   [START ON 02/22/2023] heparin  5,000 Units Subcutaneous Q8H   hydrALAZINE  25 mg Oral TID   potassium chloride  40 mEq Oral BID   sacubitril-valsartan  1 tablet Oral BID   sodium chloride flush  3 mL Intravenous Q12H   sodium chloride flush  3 mL Intravenous Q12H   spironolactone  25 mg Oral Daily   Continuous Infusions:  sodium chloride     PRN Meds: sodium chloride, acetaminophen, ondansetron (ZOFRAN) IV, sodium chloride flush  Allergies:   No Known Allergies  Social History:   Social History   Socioeconomic History   Marital status: Single    Spouse name: Not on file   Number of children: Not on file   Years of education: Not on file   Highest education level: Not on file  Occupational History   Not on file  Tobacco Use   Smoking status: Never   Smokeless tobacco: Never  Vaping Use   Vaping Use: Never used  Substance and Sexual Activity   Alcohol use: Yes    Comment: rare   Drug use: No   Sexual activity: Yes    Birth control/protection: Condom  Other Topics Concern   Not on file  Social History Narrative   Not on file   Social Determinants of Health   Financial Resource Strain: Not on file  Food Insecurity: Food Insecurity Present (02/21/2023)   Hunger Vital Sign    Worried About Running Out of Food in the Last Year: Sometimes true    Ran Out of Food in the Last Year: Sometimes true  Transportation Needs: No Transportation Needs (02/21/2023)   PRAPARE - Administrator, Civil Service (Medical): No    Lack of Transportation (Non-Medical): No  Physical Activity: Not on file  Stress: Not on file  Social Connections: Not on file  Intimate Partner Violence: Not At Risk (02/21/2023)   Humiliation, Afraid, Rape, and Kick  questionnaire    Fear of Current or Ex-Partner: No    Emotionally Abused: No    Physically Abused: No    Sexually Abused: No    Family History:    Family History  Problem Relation Age of Onset   Hypertension Mother    Diabetes Mother      ROS:  Please see the history of present illness.  General:no colds or fevers, + weight changes Skin:no rashes or ulcers HEENT:no blurred  vision, no congestion CV:see HPI PUL:see HPI GI:no diarrhea constipation or melena, no indigestion GU:no hematuria, no dysuria MS:no joint pain, no claudication Neuro:no syncope, no lightheadedness Endo:no diabetes, no thyroid disease  All other ROS reviewed and negative.     Physical Exam/Data:   Vitals:   02/21/23 0652 02/21/23 0815 02/21/23 1030 02/21/23 1145  BP: (!) 153/97 (!) 152/104 136/84 136/84  Pulse: 88  88 79  Resp: 18  (!) 21 (!) 23  Temp:      TempSrc:      SpO2: 93%  98% 99%    Intake/Output Summary (Last 24 hours) at 02/21/2023 1244 Last data filed at 02/21/2023 0057 Gross per 24 hour  Intake 38.79 ml  Output --  Net 38.79 ml      09/22/2022   11:48 AM 09/21/2022   10:36 AM 08/30/2022   11:59 AM  Last 3 Weights  Weight (lbs) 134 lb 133 lb 131 lb 9.6 oz  Weight (kg) 60.782 kg 60.328 kg 59.693 kg     There is no height or weight on file to calculate BMI.  General:  Thin female, in no acute distress now HEENT: normal Neck: JVD to jaw line Vascular: No carotid bruits; Distal pulses 2+ bilaterally Cardiac:  normal S1, S2; RRR; no murmur gallup rub or click Lungs:  few rales in bases to auscultation bilaterally, no wheezing, rhonchi   Abd: soft, nontender, no hepatomegaly  Ext: + edema more in thighs and back some in lower legs Musculoskeletal:  No deformities, BUE and BLE strength normal and equal Skin: warm and dry at core, arms cooler Neuro:  alert and oriented X 3 MAE follows commands, no focal abnormalities noted Psych:  Normal affect    Relevant CV Studies: Echo  08/10/22 IMPRESSIONS     1. Left ventricular ejection fraction, by estimation, is 20 to 25%. The  left ventricle has severely decreased function. The left ventricle  demonstrates global hypokinesis. Left ventricular diastolic parameters are  consistent with Grade II diastolic  dysfunction (pseudonormalization). There is the interventricular septum is  flattened in diastole ('D' shaped left ventricle), consistent with right  ventricular volume overload. The average left ventricular global  longitudinal strain is -10.5 %. The  global longitudinal strain is abnormal.   2. Right ventricular systolic function is moderately reduced. The right  ventricular size is moderately enlarged. There is moderately elevated  pulmonary artery systolic pressure. The estimated right ventricular  systolic pressure is 51.0 mmHg.   3. Left atrial size was moderately dilated.   4. Right atrial size was moderately dilated.   5. A small pericardial effusion is present. The pericardial effusion is  localized near the right ventricle and localized near the right atrium.  There is no evidence of cardiac tamponade.   6. The mitral valve is normal in structure. Mild mitral valve  regurgitation. No evidence of mitral stenosis.   7. Tricuspid valve regurgitation is severe.   8. The aortic valve is tricuspid. Aortic valve regurgitation is trivial.  No aortic stenosis is present.   9. The inferior vena cava is normal in size with <50% respiratory  variability, suggesting right atrial pressure of 8 mmHg.   Comparison(s): No prior Echocardiogram.   Conclusion(s)/Recommendation(s): Severely reduced LVEF with septal bounce,  decreased RV function, RVSP 51, severe TR. Findings communicated with Dr.  Shari Prows.   FINDINGS   Left Ventricle: Left ventricular ejection fraction, by estimation, is 20  to 25%. The left ventricle has severely decreased function.  The left  ventricle demonstrates global hypokinesis. The average  left ventricular  global longitudinal strain is -10.5 %.  The global longitudinal strain is abnormal. The left ventricular internal  cavity size was normal in size. There is no left ventricular hypertrophy.  The interventricular septum is flattened in diastole ('D' shaped left  ventricle), consistent with right  ventricular volume overload. Left ventricular diastolic parameters are  consistent with Grade II diastolic dysfunction (pseudonormalization).   Right Ventricle: The right ventricular size is moderately enlarged. No  increase in right ventricular wall thickness. Right ventricular systolic  function is moderately reduced. There is moderately elevated pulmonary  artery systolic pressure. The tricuspid   regurgitant velocity is 3.28 m/s, and with an assumed right atrial  pressure of 8 mmHg, the estimated right ventricular systolic pressure is  51.0 mmHg.   Left Atrium: Left atrial size was moderately dilated.   Right Atrium: Right atrial size was moderately dilated.   Pericardium: A small pericardial effusion is present. The pericardial  effusion is localized near the right ventricle and localized near the  right atrium. There is no evidence of cardiac tamponade.   Mitral Valve: The mitral valve is normal in structure. Mild mitral valve  regurgitation. No evidence of mitral valve stenosis.   Tricuspid Valve: The tricuspid valve is normal in structure. Tricuspid  valve regurgitation is severe. No evidence of tricuspid stenosis.   Aortic Valve: The aortic valve is tricuspid. Aortic valve regurgitation is  trivial. No aortic stenosis is present.   Pulmonic Valve: The pulmonic valve was normal in structure. Pulmonic valve  regurgitation is mild. No evidence of pulmonic stenosis.   Aorta: The aortic root, ascending aorta, aortic arch and descending aorta  are all structurally normal, with no evidence of dilitation or  obstruction.   Venous: The inferior vena cava is normal in  size with less than 50%  respiratory variability, suggesting right atrial pressure of 8 mmHg.   IAS/Shunts: The atrial septum is grossly normal.       Laboratory Data:  High Sensitivity Troponin:   Recent Labs  Lab 02/20/23 1700  TROPONINIHS 13     Chemistry Recent Labs  Lab 02/20/23 1700 02/21/23 0555  NA 138 139  K 3.3* 3.6  CL 104 104  CO2 24 23  GLUCOSE 99 83  BUN 8 7  CREATININE 0.83 0.79  CALCIUM 8.9 8.7*  MG  --  1.7  GFRNONAA >60 >60  ANIONGAP 10 12    Recent Labs  Lab 02/20/23 1700 02/21/23 0555  PROT 7.9 7.4  ALBUMIN 2.8* 2.9*  AST 26 28  ALT 13 13  ALKPHOS 140* 124  BILITOT 1.7* 2.3*   Lipids No results for input(s): "CHOL", "TRIG", "HDL", "LABVLDL", "LDLCALC", "CHOLHDL" in the last 168 hours.  Hematology Recent Labs  Lab 02/20/23 1700  WBC 5.1  RBC 5.52*  HGB 12.0  HCT 36.6  MCV 66.3*  MCH 21.7*  MCHC 32.8  RDW 27.1*  PLT 327   Thyroid No results for input(s): "TSH", "FREET4" in the last 168 hours.  BNP Recent Labs  Lab 02/20/23 1700  BNP 2,176.0*    DDimer No results for input(s): "DDIMER" in the last 168 hours.   Radiology/Studies:  IR Paracentesis  Result Date: 02/21/2023 INDICATION: History of chronic systolic heart failure with EF of 20-25% and HTN with complaint of shortness of breath and lower extremity edema. Patient found to have masses ascites on ultrasound exam. Request received for diagnostic and therapeutic paracentesis.  EXAM: ULTRASOUND GUIDED DIAGNOSTIC AND THERAPEUTIC LEFT LOWER QUADRANT PARACENTESIS MEDICATIONS: 10 mL 1 % lidocaine COMPLICATIONS: None immediate. PROCEDURE: Informed written consent was obtained from the patient after a discussion of the risks, benefits and alternatives to treatment. A timeout was performed prior to the initiation of the procedure. Initial ultrasound scanning demonstrates a large amount of ascites within the left lower abdominal quadrant. The left lower abdomen was prepped and draped in  the usual sterile fashion. 1% lidocaine was used for local anesthesia. Following this, a 19 gauge, 7-cm, Yueh catheter was introduced. An ultrasound image was saved for documentation purposes. The paracentesis was performed. The catheter was removed and a dressing was applied. The patient tolerated the procedure well without immediate post procedural complication. FINDINGS: A total of approximately 3.4 L of cloudy, golden colored fluid was removed. Samples were sent to the laboratory as requested by the clinical team. IMPRESSION: Successful ultrasound-guided paracentesis yielding 3.4 liters of peritoneal fluid. Read by: [name], AGNP-BC Electronically Signed   By: Acquanetta Belling M.D.   On: 02/21/2023 09:29   DG Chest 2 View  Result Date: 02/20/2023 CLINICAL DATA:  Shortness of breath x3 weeks. EXAM: CHEST - 2 VIEW COMPARISON:  February 17, 2016 FINDINGS: There is mild to moderate severity enlargement of the cardiac silhouette. Mild diffusely increased interstitial lung markings are seen. Mild linear atelectasis is noted within the lateral aspect of the right lung base. There is a small right pleural effusion. No pneumothorax is identified. The visualized skeletal structures are unremarkable. IMPRESSION: 1. Cardiomegaly with mild interstitial edema. 2. Mild right basilar linear atelectasis. 3. Small right pleural effusion. Electronically Signed   By: Aram Candela M.D.   On: 02/20/2023 17:24     Assessment and Plan:   Ascites with paracentesis of  3.4 L  - this began 3 weeks ago. Still with fluid. Acute on chronic HFrEF -CM presumed NICM and has had plans for Rt and Lt cardiac cath but would develop HF or excessive HTN so has not yet been accomplished.  On entresto 97-103, lasix 40 once daily, farxiga 10 mg daily spironolactone 25 daily hydralazine 25 once per day, coreg 3.125 BID just started - has not had follow up echo yet, one is scheduled for today.  Continue to diuresis and control BP - goal will  be Lt and Rt heart cath.   HTN has been elevated on arrival 166/126   currently 136/84   she notes it is labile at home.  She does take her medication. Uterine fibroids per IM   Risk Assessment/Risk Scores:        New York Heart Association (NYHA) Functional Class NYHA Class III        For questions or updates, please contact [facility] Please consult [email or web address] for contact info under    Signed, Nada Boozer, NP  02/21/2023 12:44 PM   ATTENDING ATTESTATION:  After conducting a review of all available clinical information with the care team, interviewing the patient, and performing a physical exam, I agree with the findings and plan described in this note.   GEN: No acute distress.   HEENT:  MMM, +TR pulsations, no scleral icterus Cardiac: RRR, no murmurs, rubs, or gallops.  Respiratory: Clear to auscultation bilaterally. GI: Soft, nontender, distended, tympanic  MS: No edema; No deformity. Neuro:  Nonfocal  Vasc:  +2 radial pulses  Patient is 47 AAF with CM EF 20%, HTN, uterine fibroids here with ascites.  Patient noticed increasing abdominal girth  over the last few weeks and thought this was due to enlarging uterine fibroids.  After seeing her OBGYN, she was sent to the ED.  She underwent paracentesis with ~3L fluid evacuated.  She feels more comfortable.  Had previously been seen by [facility] with plans for coronary angiography and RHC which was delayed to high BP issues.  Cont current medical therapy with farxiga, spironolactone, entresto; coreg started today and will plan to uptitrate.  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  Will refer for cor angio/RHC in AM to assess PVR.  Interestingly LFTs normal.  I have reviewed the risks, indications, and alternatives to cardiac catheterization, possible angioplasty, and stenting with the patient. Risks include but are not limited to bleeding, infection, vascular injury, stroke, myocardial infection, arrhythmia,  kidney injury, radiation-related injury in the case of prolonged fluoroscopy use, emergency cardiac surgery, and death. The patient understands the risks of serious complication is 1-2 in 1000 with diagnostic cardiac cath and 1-2% or less with angioplasty/stenting.     Alverda Skeans, MD Pager [phone]

## 2023-02-21 NOTE — Plan of Care (Signed)
  Problem: Education: Goal: Understanding of CV disease, CV risk reduction, and recovery process will improve 02/21/2023 1553 by Genevie Ann, RN Outcome: Progressing 02/21/2023 1552 by Genevie Ann, RN Outcome: Progressing Goal: Individualized Educational Video(s) Outcome: Progressing

## 2023-02-21 NOTE — Plan of Care (Signed)
  Problem: Education: Goal: Understanding of CV disease, CV risk reduction, and recovery process will improve Outcome: Progressing Goal: Individualized Educational Video(s) Outcome: Progressing   

## 2023-02-22 ENCOUNTER — Inpatient Hospital Stay (HOSPITAL_COMMUNITY): Payer: BC Managed Care – PPO

## 2023-02-22 ENCOUNTER — Encounter (HOSPITAL_COMMUNITY)
Admission: EM | Disposition: A | Discharge status: Home / Self Care | Payer: Self-pay | Source: Home / Self Care | Attending: Internal Medicine

## 2023-02-22 DIAGNOSIS — I5021 Acute systolic (congestive) heart failure: Secondary | ICD-10-CM | POA: Diagnosis not present

## 2023-02-22 DIAGNOSIS — I5043 Acute on chronic combined systolic (congestive) and diastolic (congestive) heart failure: Secondary | ICD-10-CM | POA: Diagnosis not present

## 2023-02-22 HISTORY — PX: RIGHT/LEFT HEART CATH AND CORONARY ANGIOGRAPHY: CATH118266

## 2023-02-22 LAB — HCV AB W REFLEX TO QUANT PCR: HCV Ab: NONREACTIVE

## 2023-02-22 LAB — CBC
HCT: 34.7 % — ABNORMAL LOW (ref 36.0–46.0)
Hemoglobin: 11 g/dL — ABNORMAL LOW (ref 12.0–15.0)
MCH: 21.2 pg — ABNORMAL LOW (ref 26.0–34.0)
MCHC: 31.7 g/dL (ref 30.0–36.0)
MCV: 67 fL — ABNORMAL LOW (ref 80.0–100.0)
Platelets: 300 10*3/uL (ref 150–400)
RBC: 5.18 MIL/uL — ABNORMAL HIGH (ref 3.87–5.11)
RDW: 26.5 % — ABNORMAL HIGH (ref 11.5–15.5)
WBC: 4.2 10*3/uL (ref 4.0–10.5)
nRBC: 0 % (ref 0.0–0.2)

## 2023-02-22 LAB — BASIC METABOLIC PANEL
Anion gap: 7 (ref 5–15)
BUN: 11 mg/dL (ref 6–20)
CO2: 25 mmol/L (ref 22–32)
Calcium: 8.6 mg/dL — ABNORMAL LOW (ref 8.9–10.3)
Chloride: 105 mmol/L (ref 98–111)
Creatinine, Ser: 1.01 mg/dL — ABNORMAL HIGH (ref 0.44–1.00)
GFR, Estimated: >60 mL/min (ref >60)
Glucose, Bld: 99 mg/dL (ref 70–99)
Potassium: 3.5 mmol/L (ref 3.5–5.1)
Sodium: 137 mmol/L (ref 135–145)

## 2023-02-22 LAB — CULTURE, BODY FLUID W GRAM STAIN -BOTTLE: Culture: NO GROWTH

## 2023-02-22 LAB — POCT I-STAT EG7
Acid-base deficit: 2 mmol/L (ref 0.0–2.0)
Acid-base deficit: 3 mmol/L — ABNORMAL HIGH (ref 0.0–2.0)
Bicarbonate: 22.6 mmol/L (ref 20.0–28.0)
Bicarbonate: 21.8 mmol/L (ref 20.0–28.0)
Calcium, Ion: 1.24 mmol/L (ref 1.15–1.40)
Calcium, Ion: 1.22 mmol/L (ref 1.15–1.40)
HCT: 38 % (ref 36.0–46.0)
HCT: 39 % (ref 36.0–46.0)
Hemoglobin: 12.9 g/dL (ref 12.0–15.0)
Hemoglobin: 13.3 g/dL (ref 12.0–15.0)
O2 Saturation: 69 %
O2 Saturation: 69 %
Potassium: 3.9 mmol/L (ref 3.5–5.1)
Potassium: 3.8 mmol/L (ref 3.5–5.1)
Sodium: 141 mmol/L (ref 135–145)
Sodium: 141 mmol/L (ref 135–145)
TCO2: 24 mmol/L (ref 22–32)
TCO2: 23 mmol/L (ref 22–32)
pCO2, Ven: 39.1 mmHg — ABNORMAL LOW (ref 44–60)
pCO2, Ven: 37.9 mmHg — ABNORMAL LOW (ref 44–60)
pH, Ven: 7.37 (ref 7.25–7.43)
pH, Ven: 7.367 (ref 7.25–7.43)
pO2, Ven: 37 mmHg (ref 32–45)
pO2, Ven: 37 mmHg (ref 32–45)

## 2023-02-22 LAB — POCT I-STAT 7, (LYTES, BLD GAS, ICA,H+H)
Acid-base deficit: 5 mmol/L — ABNORMAL HIGH (ref 0.0–2.0)
Bicarbonate: 19.5 mmol/L — ABNORMAL LOW (ref 20.0–28.0)
Calcium, Ion: 1.18 mmol/L (ref 1.15–1.40)
HCT: 37 % (ref 36.0–46.0)
Hemoglobin: 12.6 g/dL (ref 12.0–15.0)
O2 Saturation: 94 %
Potassium: 3.8 mmol/L (ref 3.5–5.1)
Sodium: 142 mmol/L (ref 135–145)
TCO2: 20 mmol/L — ABNORMAL LOW (ref 22–32)
pCO2 arterial: 32.7 mmHg (ref 32–48)
pH, Arterial: 7.383 (ref 7.35–7.45)
pO2, Arterial: 72 mmHg — ABNORMAL LOW (ref 83–108)

## 2023-02-22 LAB — ECHOCARDIOGRAM COMPLETE
AR max vel: 2.66 cm2
AV Peak grad: 8 mmHg
Ao pk vel: 1.41 m/s
Area-P 1/2: 5.27 cm2
Calc EF: 31.4 %
Height: 59 in
S' Lateral: 4.96 cm
Single Plane A2C EF: 26.8 %
Single Plane A4C EF: 33.7 %
Weight: 2134.4 oz

## 2023-02-22 LAB — HCV INTERPRETATION

## 2023-02-22 LAB — CYTOLOGY - NON PAP

## 2023-02-22 SURGERY — RIGHT/LEFT HEART CATH AND CORONARY ANGIOGRAPHY
Anesthesia: LOCAL

## 2023-02-22 MED ORDER — SODIUM CHLORIDE 0.9% FLUSH: 3.0000 mL | INTRAVENOUS | Status: DC | PRN

## 2023-02-22 MED ORDER — FENTANYL CITRATE (PF) 100 MCG/2ML IJ SOLN
INTRAMUSCULAR | Status: DC | PRN
  Administered 2023-02-22: 25 ug via INTRAVENOUS

## 2023-02-22 MED ORDER — MIDAZOLAM HCL 2 MG/2ML IJ SOLN
INTRAMUSCULAR | Status: DC | PRN
  Administered 2023-02-22: 1 mg via INTRAVENOUS

## 2023-02-22 MED ORDER — VERAPAMIL HCL 2.5 MG/ML IV SOLN
INTRAVENOUS | Status: AC
  Filled 2023-02-22: qty 2

## 2023-02-22 MED ORDER — VERAPAMIL HCL 2.5 MG/ML IV SOLN
INTRAVENOUS | Status: DC | PRN
  Administered 2023-02-22: 10 mL via INTRA_ARTERIAL

## 2023-02-22 MED ORDER — HEPARIN (PORCINE) IN NACL 1000-0.9 UT/500ML-% IV SOLN
INTRAVENOUS | Status: DC | PRN
  Administered 2023-02-22 (×2): 500 mL

## 2023-02-22 MED ORDER — IOHEXOL 350 MG/ML SOLN
INTRAVENOUS | Status: DC | PRN
  Administered 2023-02-22: 30 mL

## 2023-02-22 MED ORDER — SODIUM CHLORIDE 0.9% FLUSH
3.0000 mL | Freq: Two times a day (BID) | INTRAVENOUS | Status: DC
  Administered 2023-02-23 – 2023-02-25 (×5): 3 mL via INTRAVENOUS

## 2023-02-22 MED ORDER — SODIUM CHLORIDE 0.9 % IV SOLN: 250.0000 mL | INTRAVENOUS | Status: DC | PRN

## 2023-02-22 MED ORDER — ASPIRIN 81 MG PO CHEW
81.0000 mg | CHEWABLE_TABLET | ORAL | Status: AC
  Administered 2023-02-22: 81 mg via ORAL
  Filled 2023-02-22: qty 1

## 2023-02-22 MED ORDER — SODIUM CHLORIDE 0.9 % IV SOLN: INTRAVENOUS | Status: DC

## 2023-02-22 MED ORDER — MIDAZOLAM HCL 2 MG/2ML IJ SOLN
INTRAMUSCULAR | Status: AC
  Filled 2023-02-22: qty 2

## 2023-02-22 MED ORDER — MAGNESIUM SULFATE 2 GM/50ML IV SOLN
2.0000 g | Freq: Once | INTRAVENOUS | Status: AC
  Administered 2023-02-22: 2 g via INTRAVENOUS
  Filled 2023-02-22: qty 50

## 2023-02-22 MED ORDER — DAPAGLIFLOZIN PROPANEDIOL 10 MG PO TABS
10.0000 mg | ORAL_TABLET | Freq: Every day | ORAL | Status: DC
  Administered 2023-02-22 – 2023-02-26 (×5): 10 mg via ORAL
  Filled 2023-02-22 (×5): qty 1

## 2023-02-22 MED ORDER — METOPROLOL SUCCINATE ER 25 MG PO TB24
12.5000 mg | ORAL_TABLET | Freq: Every day | ORAL | Status: DC
  Administered 2023-02-22 – 2023-02-25 (×4): 12.5 mg via ORAL
  Filled 2023-02-22 (×4): qty 1

## 2023-02-22 MED ORDER — LIDOCAINE HCL (PF) 1 % IJ SOLN
INTRAMUSCULAR | Status: AC
  Filled 2023-02-22: qty 30

## 2023-02-22 MED ORDER — HEPARIN SODIUM (PORCINE) 1000 UNIT/ML IJ SOLN
INTRAMUSCULAR | Status: DC | PRN
  Administered 2023-02-22: 3000 [IU] via INTRAVENOUS

## 2023-02-22 MED ORDER — PERFLUTREN LIPID MICROSPHERE
1.0000 mL | INTRAVENOUS | Status: AC | PRN
  Administered 2023-02-22: 2 mL via INTRAVENOUS

## 2023-02-22 MED ORDER — LIDOCAINE HCL (PF) 1 % IJ SOLN
INTRAMUSCULAR | Status: DC | PRN
  Administered 2023-02-22 (×2): 2 mL via INTRADERMAL

## 2023-02-22 MED ORDER — FENTANYL CITRATE (PF) 100 MCG/2ML IJ SOLN
INTRAMUSCULAR | Status: AC
  Filled 2023-02-22: qty 2

## 2023-02-22 MED ORDER — HEPARIN SODIUM (PORCINE) 5000 UNIT/ML IJ SOLN
5000.0000 [IU] | Freq: Three times a day (TID) | INTRAMUSCULAR | Status: DC
  Administered 2023-02-22 – 2023-02-23 (×3): 5000 [IU] via SUBCUTANEOUS
  Filled 2023-02-22 (×3): qty 1

## 2023-02-22 MED ORDER — HEPARIN SODIUM (PORCINE) 1000 UNIT/ML IJ SOLN
INTRAMUSCULAR | Status: AC
  Filled 2023-02-22: qty 10

## 2023-02-22 SURGICAL SUPPLY — 14 items
CATH 5FR JL3.5 JR4 ANG PIG MP (CATHETERS) IMPLANT
CATH BALLN WEDGE 5F 110CM (CATHETERS) IMPLANT
DEVICE RAD TR BAND REGULAR (VASCULAR PRODUCTS) IMPLANT
GLIDESHEATH SLEND SS 6F .021 (SHEATH) IMPLANT
GUIDEWIRE INQWIRE 1.5J.035X260 (WIRE) IMPLANT
INQWIRE 1.5J .035X260CM (WIRE) ×1
KIT HEART LEFT (KITS) ×1 IMPLANT
PACK CARDIAC CATHETERIZATION (CUSTOM PROCEDURE TRAY) ×1 IMPLANT
SHEATH GLIDE SLENDER 4/5FR (SHEATH) IMPLANT
SHEATH PROBE COVER 6X72 (BAG) IMPLANT
SYR MEDRAD MARK 7 150ML (SYRINGE) ×1 IMPLANT
TRANSDUCER W/STOPCOCK (MISCELLANEOUS) ×1 IMPLANT
TUBING CIL FLEX 10 FLL-RA (TUBING) ×1 IMPLANT
WIRE EMERALD 3MM-J .025X260CM (WIRE) IMPLANT

## 2023-02-22 NOTE — Progress Notes (Addendum)
Rounding Note    Patient Name: Mary Rios Date of Encounter: 02/22/2023  [facility] Cardiologist: Meriam Sprague, MD   Subjective   Sitting up in bed comfortably. Echo at the bedside.   Inpatient Medications    Scheduled Meds:  acetaZOLAMIDE ER  500 mg Oral Q12H   carvedilol  3.125 mg Oral BID WC   furosemide  40 mg Intravenous Q12H   heparin  5,000 Units Subcutaneous Q8H   hydrALAZINE  25 mg Oral TID   potassium chloride  40 mEq Oral BID   sacubitril-valsartan  1 tablet Oral BID   sodium chloride flush  3 mL Intravenous Q12H   spironolactone  25 mg Oral Daily   Continuous Infusions:  sodium chloride     sodium chloride     [START ON 02/23/2023] sodium chloride     PRN Meds: sodium chloride, sodium chloride, acetaminophen, ondansetron (ZOFRAN) IV, sodium chloride flush, sodium chloride flush   Vital Signs    Vitals:   02/21/23 1943 02/22/23 0051 02/22/23 0527 02/22/23 0705  BP: 115/73 122/78 124/78 121/87  Pulse: 78 73 73 66  Resp: Temp: 98 F (36.7 C) 98 F (36.7 C) 97.9 F (36.6 C) 97.9 F (36.6 C)  TempSrc: Oral Oral Oral Oral  SpO2: 99% 97% 97% 98%  Weight:   60.5 kg   Height:        Intake/Output Summary (Last 24 hours) at 02/22/2023 0943 Last data filed at 02/22/2023 0849 Gross per 24 hour  Intake 603 ml  Output 1700 ml  Net -1097 ml      02/22/2023    5:27 AM 02/21/2023    3:20 PM 09/22/2022   11:48 AM  Last 3 Weights  Weight (lbs) 133 lb 6.4 oz 137 lb 2 oz 134 lb  Weight (kg) 60.51 kg 62.2 kg 60.782 kg      Telemetry    Sinus Rhythm - Personally Reviewed  ECG    No new tracing this morning  Physical Exam   GEN: No acute distress.   Neck: No JVD Cardiac: RRR, + systolic murmur LLSB, no rubs, or gallops.  Respiratory: Clear to auscultation bilaterally. GI: Soft, nontender, distended; firm MS: No edema; No deformity. Neuro:  Nonfocal  Psych: Normal affect   Labs    High Sensitivity Troponin:    Recent Labs  Lab 02/20/23 1700  TROPONINIHS 13     Chemistry Recent Labs  Lab 02/20/23 1700 02/21/23 0555 02/22/23 0600  NA 138 139 137  K 3.3* 3.6 3.5  CL 104 104 105  CO2 GLUCOSE 99 83 99  BUN CREATININE 0.83 0.79 1.01*  CALCIUM 8.9 8.7* 8.6*  MG  --  1.7  --   PROT 7.9 7.4  --   ALBUMIN 2.8* 2.9*  --   AST 26 28  --   ALT 13 13  --   ALKPHOS 140* 124  --   BILITOT 1.7* 2.3*  --   GFRNONAA >60 >60 >60  ANIONGAP Lipids No results for input(s): "CHOL", "TRIG", "HDL", "LABVLDL", "LDLCALC", "CHOLHDL" in the last 168 hours.  Hematology Recent Labs  Lab 02/20/23 1700 02/22/23 0600  WBC 5.1 4.2  RBC 5.52* 5.18*  HGB 12.0 11.0*  HCT 36.6 34.7*  MCV 66.3* 67.0*  MCH 21.7* 21.2*  MCHC 32.8 31.7  RDW 27.1* 26.5*  PLT 327 300  Thyroid  Recent Labs  Lab 02/21/23 1515  TSH 1.715  FREET4 1.22*    BNP Recent Labs  Lab 02/20/23 1700  BNP 2,176.0*    DDimer No results for input(s): "DDIMER" in the last 168 hours.   Radiology    IR Paracentesis  Result Date: 02/21/2023 INDICATION: History of chronic systolic heart failure with EF of 20-25% and HTN with complaint of shortness of breath and lower extremity edema. Patient found to have masses ascites on ultrasound exam. Request received for diagnostic and therapeutic paracentesis. EXAM: ULTRASOUND GUIDED DIAGNOSTIC AND THERAPEUTIC LEFT LOWER QUADRANT PARACENTESIS MEDICATIONS: 10 mL 1 % lidocaine COMPLICATIONS: None immediate. PROCEDURE: Informed written consent was obtained from the patient after a discussion of the risks, benefits and alternatives to treatment. A timeout was performed prior to the initiation of the procedure. Initial ultrasound scanning demonstrates a large amount of ascites within the left lower abdominal quadrant. The left lower abdomen was prepped and draped in the usual sterile fashion. 1% lidocaine was used for local anesthesia. Following this, a 19 gauge, 7-cm, Yueh  catheter was introduced. An ultrasound image was saved for documentation purposes. The paracentesis was performed. The catheter was removed and a dressing was applied. The patient tolerated the procedure well without immediate post procedural complication. FINDINGS: A total of approximately 3.4 L of cloudy, golden colored fluid was removed. Samples were sent to the laboratory as requested by the clinical team. IMPRESSION: Successful ultrasound-guided paracentesis yielding 3.4 liters of peritoneal fluid. Read by: [name], AGNP-BC Electronically Signed   By: Acquanetta Belling M.D.   On: 02/21/2023 09:29   DG Chest 2 View  Result Date: 02/20/2023 CLINICAL DATA:  Shortness of breath x3 weeks. EXAM: CHEST - 2 VIEW COMPARISON:  February 17, 2016 FINDINGS: There is mild to moderate severity enlargement of the cardiac silhouette. Mild diffusely increased interstitial lung markings are seen. Mild linear atelectasis is noted within the lateral aspect of the right lung base. There is a small right pleural effusion. No pneumothorax is identified. The visualized skeletal structures are unremarkable. IMPRESSION: 1. Cardiomegaly with mild interstitial edema. 2. Mild right basilar linear atelectasis. 3. Small right pleural effusion. Electronically Signed   By: Aram Candela M.D.   On: 02/20/2023 17:24    Cardiac Studies   Echo: pending  Patient Profile     47 y.o. female a hx of HFrEF in 2023 on GDMT, HTN, uterine fibroids who was seen 02/21/2023 for the evaluation of CHF at the request of Dr. Jacqulyn Bath.   Assessment & Plan    HFrEF BiV HF Severe TR -- echo 08/2022 with LVEF of 20-25%, g2DD, moderate RV enlargement/reduced, moderately elevated PA pressures, moderate biatrial enlargement, severe TR -- has been planned for [facility] as an outpatient, presented with from OBGYN office with increases abd girth -- GDMT: continue coreg 3.125mg  BID, entresto 97-103 BID, spiro 25mg  daily, hydralazine 25mg  TID, farxiga  -- IV  lasix 40mg  BID, along with diamox 500mg  BID  -- planned for [facility] today -- supp mag/K+  Shared Decision Making/Informed Consent The risks [stroke (1 in 1000), death (1 in 1000), kidney failure [usually temporary] (1 in 500), bleeding (1 in 200), allergic reaction [possibly serious] (1 in 200)], benefits (diagnostic support and management of coronary artery disease) and alternatives of a cardiac catheterization were discussed in detail with Ms. Vieyra and she is willing to proceed.  HTN -- initially elevated, but now improved -- continue coreg 3.125mg  BID, entresto, spiro  Ascites -- s/p paracentesis  with 3.4L removed 3 weeks prior  Uterine fibroids -- follows with OBGYN -- per primary   For questions or updates, please contact [facility] Please consult [email or web address] for contact info under        Signed, Laverda Page, NP  02/22/2023, 9:43 AM     ATTENDING ATTESTATION:  After conducting a review of all available clinical information with the care team, interviewing the patient, and performing a physical exam, I agree with the findings and plan described in this note.   GEN: No acute distress.   HEENT:  MMM, no JVD, no scleral icterus Cardiac: RRR, with 2/6 systolic murmur Respiratory: Clear to auscultation bilaterally. GI: Soft, nontender, distended  MS: No edema; No deformity. Neuro:  Nonfocal  Vasc:  +2 radial pulses, trace edema, warm and well perfused  Patient doing well and experienced brisk diuresis overnight.  She is stable in the supine position.  Will refer for coronary angiography and right heart catheterization.  Echocardiogram that I reviewed today demonstrates RV dilatation and moderate to severe tricuspid regurgitation with severe LV dysfunction.  We will assess her filling pressures and PVR which will help guide therapy.  It is unclear to me whether her ascites is related to right heart function at this point in time.  Of note her LFTs are within  normal limits.  Further recommendations will be informed by the cardiac catheterization and right heart catheterization study.  Will change Coreg to Toprol-XL 12.5 mg at bedtime.  Otherwise continue Farxiga, Lasix twice daily, high-dose Entresto, and spironolactone 25 mg.  I have reviewed the risks, indications, and alternatives to cardiac catheterization, possible angioplasty, and stenting with the patient. Risks include but are not limited to bleeding, infection, vascular injury, stroke, myocardial infection, arrhythmia, kidney injury, radiation-related injury in the case of prolonged fluoroscopy use, emergency cardiac surgery, and death. The patient understands the risks of serious complication is 1-2 in 1000 with diagnostic cardiac cath and 1-2% or less with angioplasty/stenting.     Alverda Skeans, MD Pager [phone]

## 2023-02-22 NOTE — Interval H&P Note (Signed)
History and Physical Interval Note:  02/22/2023 1:55 PM  Mary Rios  has presented today for surgery, with the diagnosis of cardiomyopathy.  The various methods of treatment have been discussed with the patient and family. After consideration of risks, benefits and other options for treatment, the patient has consented to  Procedure(s): RIGHT/LEFT HEART CATH AND CORONARY ANGIOGRAPHY (N/A) as a surgical intervention.  The patient's history has been reviewed, patient examined, no change in status, stable for surgery.  I have reviewed the patient's chart and labs.  Questions were answered to the patient's satisfaction.    Cath Lab Visit (complete for each Cath Lab visit)  Clinical Evaluation Leading to the Procedure:   ACS: No.  Non-ACS:    Anginal Classification: CCS III  Anti-ischemic medical therapy: Minimal Therapy (1 class of medications)  Non-Invasive Test Results: No non-invasive testing performed  Prior CABG: No previous CABG       Theron Ar[facility] 02/22/2023 1:55 PM

## 2023-02-22 NOTE — Progress Notes (Signed)
Echocardiogram 2D Echocardiogram has been performed.  Lucendia Herrlich 02/22/2023, 10:26 AM

## 2023-02-22 NOTE — Consult Note (Signed)
   [facility] CM Inpatient Consult   02/22/2023  [name] [birth year] [id number]   [facility] [[facility]]  Accountable Care Organization [ACO] Patient: Mary Rios [facility] PPO  Primary Care Provider:  Diamantina Providence, FNP, with Premium   Patient screened for hospitalization with noted *** risk score for unplanned readmission risk ***length of stay and *** to assess for potential [facility]  [[facility]] Care Management service needs for post hospital transition for care coordination.  Review of patient's electronic medical record reveals patient is ***   Plan:  ***Continue to follow progress and disposition to assess for post hospital community care coordination/management needs.  ***Referral request for community care coordination:  ***Of note, [facility] Care Management/Population Health does not replace or interfere with any arrangements made by the Inpatient Transition of Care team.  For questions contact:   ***Charlesetta Shanks, RN BSN CCM [facility]  [phone] business mobile phone Toll free office [phone]  *Concierge Line  [phone] Fax number: [phone] [email or web address]

## 2023-02-22 NOTE — Plan of Care (Signed)
  Problem: Education: Goal: Understanding of CV disease, CV risk reduction, and recovery process will improve Outcome: Progressing   

## 2023-02-22 NOTE — H&P (View-Only) (Signed)
Rounding Note    Patient Name: Mary Rios Date of Encounter: 02/22/2023  [facility] Cardiologist: Meriam Sprague, MD   Subjective   Sitting up in bed comfortably. Echo at the bedside.   Inpatient Medications    Scheduled Meds:  acetaZOLAMIDE ER  500 mg Oral Q12H   carvedilol  3.125 mg Oral BID WC   furosemide  40 mg Intravenous Q12H   heparin  5,000 Units Subcutaneous Q8H   hydrALAZINE  25 mg Oral TID   potassium chloride  40 mEq Oral BID   sacubitril-valsartan  1 tablet Oral BID   sodium chloride flush  3 mL Intravenous Q12H   spironolactone  25 mg Oral Daily   Continuous Infusions:  sodium chloride     sodium chloride     [START ON 02/23/2023] sodium chloride     PRN Meds: sodium chloride, sodium chloride, acetaminophen, ondansetron (ZOFRAN) IV, sodium chloride flush, sodium chloride flush   Vital Signs    Vitals:   02/21/23 1943 02/22/23 0051 02/22/23 0527 02/22/23 0705  BP: 115/73 122/78 124/78 121/87  Pulse: 78 73 73 66  Resp: Temp: 98 F (36.7 C) 98 F (36.7 C) 97.9 F (36.6 C) 97.9 F (36.6 C)  TempSrc: Oral Oral Oral Oral  SpO2: 99% 97% 97% 98%  Weight:   60.5 kg   Height:        Intake/Output Summary (Last 24 hours) at 02/22/2023 0943 Last data filed at 02/22/2023 0849 Gross per 24 hour  Intake 603 ml  Output 1700 ml  Net -1097 ml      02/22/2023    5:27 AM 02/21/2023    3:20 PM 09/22/2022   11:48 AM  Last 3 Weights  Weight (lbs) 133 lb 6.4 oz 137 lb 2 oz 134 lb  Weight (kg) 60.51 kg 62.2 kg 60.782 kg      Telemetry    Sinus Rhythm - Personally Reviewed  ECG    No new tracing this morning  Physical Exam   GEN: No acute distress.   Neck: No JVD Cardiac: RRR, + systolic murmur LLSB, no rubs, or gallops.  Respiratory: Clear to auscultation bilaterally. GI: Soft, nontender, non-distended  MS: No edema; No deformity. Neuro:  Nonfocal  Psych: Normal affect   Labs    High Sensitivity Troponin:    Recent Labs  Lab 02/20/23 1700  TROPONINIHS 13     Chemistry Recent Labs  Lab 02/20/23 1700 02/21/23 0555 02/22/23 0600  NA 138 139 137  K 3.3* 3.6 3.5  CL 104 104 105  CO2 GLUCOSE 99 83 99  BUN CREATININE 0.83 0.79 1.01*  CALCIUM 8.9 8.7* 8.6*  MG  --  1.7  --   PROT 7.9 7.4  --   ALBUMIN 2.8* 2.9*  --   AST 26 28  --   ALT 13 13  --   ALKPHOS 140* 124  --   BILITOT 1.7* 2.3*  --   GFRNONAA >60 >60 >60  ANIONGAP Lipids No results for input(s): "CHOL", "TRIG", "HDL", "LABVLDL", "LDLCALC", "CHOLHDL" in the last 168 hours.  Hematology Recent Labs  Lab 02/20/23 1700 02/22/23 0600  WBC 5.1 4.2  RBC 5.52* 5.18*  HGB 12.0 11.0*  HCT 36.6 34.7*  MCV 66.3* 67.0*  MCH 21.7* 21.2*  MCHC 32.8 31.7  RDW 27.1* 26.5*  PLT 327 300  Thyroid  Recent Labs  Lab 02/21/23 1515  TSH 1.715  FREET4 1.22*    BNP Recent Labs  Lab 02/20/23 1700  BNP 2,176.0*    DDimer No results for input(s): "DDIMER" in the last 168 hours.   Radiology    IR Paracentesis  Result Date: 02/21/2023 INDICATION: History of chronic systolic heart failure with EF of 20-25% and HTN with complaint of shortness of breath and lower extremity edema. Patient found to have masses ascites on ultrasound exam. Request received for diagnostic and therapeutic paracentesis. EXAM: ULTRASOUND GUIDED DIAGNOSTIC AND THERAPEUTIC LEFT LOWER QUADRANT PARACENTESIS MEDICATIONS: 10 mL 1 % lidocaine COMPLICATIONS: None immediate. PROCEDURE: Informed written consent was obtained from the patient after a discussion of the risks, benefits and alternatives to treatment. A timeout was performed prior to the initiation of the procedure. Initial ultrasound scanning demonstrates a large amount of ascites within the left lower abdominal quadrant. The left lower abdomen was prepped and draped in the usual sterile fashion. 1% lidocaine was used for local anesthesia. Following this, a 19 gauge, 7-cm, Yueh  catheter was introduced. An ultrasound image was saved for documentation purposes. The paracentesis was performed. The catheter was removed and a dressing was applied. The patient tolerated the procedure well without immediate post procedural complication. FINDINGS: A total of approximately 3.4 L of cloudy, golden colored fluid was removed. Samples were sent to the laboratory as requested by the clinical team. IMPRESSION: Successful ultrasound-guided paracentesis yielding 3.4 liters of peritoneal fluid. Read by: [name], AGNP-BC Electronically Signed   By: Acquanetta Belling M.D.   On: 02/21/2023 09:29   DG Chest 2 View  Result Date: 02/20/2023 CLINICAL DATA:  Shortness of breath x3 weeks. EXAM: CHEST - 2 VIEW COMPARISON:  February 17, 2016 FINDINGS: There is mild to moderate severity enlargement of the cardiac silhouette. Mild diffusely increased interstitial lung markings are seen. Mild linear atelectasis is noted within the lateral aspect of the right lung base. There is a small right pleural effusion. No pneumothorax is identified. The visualized skeletal structures are unremarkable. IMPRESSION: 1. Cardiomegaly with mild interstitial edema. 2. Mild right basilar linear atelectasis. 3. Small right pleural effusion. Electronically Signed   By: Aram Candela M.D.   On: 02/20/2023 17:24    Cardiac Studies   Echo: pending  Patient Profile     47 y.o. female a hx of HFrEF in 2023 on GDMT, HTN, uterine fibroids who was seen 02/21/2023 for the evaluation of CHF at the request of Dr. Jacqulyn Bath.   Assessment & Plan    HFrEF BiV HF Severe TR -- echo 08/2022 with LVEF of 20-25%, g2DD, moderate RV enlargement/reduced, moderately elevated PA pressures, moderate biatrial enlargement, severe TR -- has been planned for [facility] as an outpatient, presented with from OBGYN office with increases abd girth -- GDMT: continue coreg 3.125mg  BID, entresto 97-103 BID, spiro 25mg  daily, hydralazine 25mg  TID, farxiga  -- IV  lasix 40mg  BID, along with diamox 500mg  BID  -- planned for [facility] today -- supp mag/K+  Shared Decision Making/Informed Consent The risks [stroke (1 in 1000), death (1 in 1000), kidney failure [usually temporary] (1 in 500), bleeding (1 in 200), allergic reaction [possibly serious] (1 in 200)], benefits (diagnostic support and management of coronary artery disease) and alternatives of a cardiac catheterization were discussed in detail with Ms. Eifler and she is willing to proceed.  HTN -- initially elevated, but now improved -- continue coreg 3.125mg  BID, entresto, spiro  Ascites -- s/p paracentesis  with 3.4L removed 3 weeks prior  Uterine fibroids -- follows with OBGYN -- per primary   For questions or updates, please contact [facility] Please consult [email or web address] for contact info under        Signed, Laverda Page, NP  02/22/2023, 9:43 AM     ATTENDING ATTESTATION:  After conducting a review of all available clinical information with the care team, interviewing the patient, and performing a physical exam, I agree with the findings and plan described in this note.   GEN: No acute distress.   HEENT:  MMM, no JVD, no scleral icterus Cardiac: RRR, with 2/6 systolic murmur Respiratory: Clear to auscultation bilaterally. GI: Soft, nontender, distended  MS: No edema; No deformity. Neuro:  Nonfocal  Vasc:  +2 radial pulses, trace edema, warm and well perfused  Patient doing well and experienced brisk diuresis overnight.  She is stable in the supine position.  Will refer for coronary angiography and right heart catheterization.  Echocardiogram that I reviewed today demonstrates RV dilatation and moderate to severe tricuspid regurgitation with severe LV dysfunction.  We will assess her filling pressures and PVR which will help guide therapy.  It is unclear to me whether her ascites is related to right heart function at this point in time.  Of note her LFTs are within  normal limits.  Further recommendations will be informed by the cardiac catheterization and right heart catheterization study.  Will change Coreg to Toprol-XL 12.5 mg at bedtime.  Otherwise continue Farxiga, Lasix twice daily, high-dose Entresto, and spironolactone 25 mg.  I have reviewed the risks, indications, and alternatives to cardiac catheterization, possible angioplasty, and stenting with the patient. Risks include but are not limited to bleeding, infection, vascular injury, stroke, myocardial infection, arrhythmia, kidney injury, radiation-related injury in the case of prolonged fluoroscopy use, emergency cardiac surgery, and death. The patient understands the risks of serious complication is 1-2 in 1000 with diagnostic cardiac cath and 1-2% or less with angioplasty/stenting.     Alverda Skeans, MD Pager [phone]

## 2023-02-22 NOTE — TOC Progression Note (Signed)
Transition of Care [facility]) - Progression Note    Patient Details  Name: Mary Rios MRN: [id number] Date of Birth: [birth year]  [facility] CM/SW Contact  Leone Haven, RN Phone Number: 02/22/2023, 1:49 PM  Clinical Narrative:     from home with daughter, CHF, for cath today, indep. TOC following.        Expected Discharge Plan and Services                                               Social Determinants of Health (SDOH) Interventions SDOH Screenings   Food Insecurity: Food Insecurity Present (02/21/2023)  Housing: Low Risk  (02/21/2023)  Transportation Needs: No Transportation Needs (02/21/2023)  Utilities: Not At Risk (02/21/2023)  Tobacco Use: Low Risk  (02/21/2023)    Readmission Risk Interventions     No data to display

## 2023-02-22 NOTE — Progress Notes (Signed)
PROGRESS NOTE    Mary Rios  WUJ:[id number] DOB: [birth year] DOA: 02/20/2023 PCP: Diamantina Providence, FNP   Brief Narrative:  47 year old African-American female history of chronic systolic heart failure EF of 20 to 25%, hypertension who presents to the ER today with worsening abdominal swelling.  She states that her abdomen has been swelling for about 3 weeks now.  She thought that this was due to her fibroids growing.  She went to her GYN physician today who immediately told her to go to the ER and that her abdominal swelling was not from fibroids.  She states that her weight in the office overall was around 155 pounds.  She is never weighed this much before.  She has had increasing leg swelling along with her abdominal swelling for about 3 weeks.  She states that she has been taking her Lasix 40 mg per day.  She states that she has been taking her Entresto.  She is not on a beta-blocker.   She was last seen by cardiology in November 2023.  She was lost to follow-up and never made it to her December appointment.  She was supposed to be scheduled for a left and right heart catheterization but this never happened.   On arrival to the ER, temp 98.1 heart rate 100 blood pressure 163/112 satting 97% on room air.   Labs: BNP 2176 White count 5.1, hemoglobin 12.0, platelets of 327   Sodium 138, potassium 3.3, bicarb 24, BUN of 8, creatinine 0.8 AST 26 ALT of 13 alk phos 140 total bili 1.7   Chest x-ray showed cardiomegaly with mild interstitial edema.  Assessment & Plan:   Principal Problem:   Acute on chronic combined systolic and diastolic CHF (congestive heart failure) Active Problems:   Benign essential HTN   Ascites   Hypokalemia  Acute on chronic combined systolic and diastolic CHF (congestive heart failure) She was following cardiology, patient lost to follow-up since [place].  She never got her right and left heart cath. She has been resumed on  her previous medications which include Coreg, hydralazine, Entresto, Aldactone and she is also on Lasix 40 mg IV twice daily.  Cardiology consulted and she is scheduled for cardiac cath today.   Ascites IR consulted and patient is status post paracentesis with removal of 3.4 L of fluid on 02/21/2023.  She feels better now.  However she is still has significant ascites left.  I think she may benefit from another paracentesis but since she is going to be very busy with cardiac cath today, will reassess tomorrow and if needed, consult IR for paracentesis.   Hypertensive urgency: Presented with blood pressure of 163/112.  Resume home medications which include hydralazine, Coreg was added here.  She is on IV Lasix as well.  Blood pressure very well-controlled.   Hypokalemia Resolved.  Renal insufficiency: Baseline creatinine around 0.85, slightly elevated creatinine at 1.01, does not meet criteria for AKI.  At risk of developing AKI now that she is going to have contrast.  Will reassess tomorrow.  Repeat labs in the morning.  DVT prophylaxis: heparin injection 5,000 Units Start: 02/22/23 0800 SCDs Start: 02/21/23 0544   Code Status: Full Code  Family Communication:  None present at bedside.  Plan of care discussed with patient in length and he/she verbalized understanding and agreed with it.  Status is: Inpatient Remains inpatient appropriate because: Scheduled for cardiac catheter today.   Estimated body mass index is  26.94 kg/m as calculated from the following:   Height as of this encounter:  (1.499 m).   Weight as of this encounter: 60.5 kg.    Nutritional Assessment: Body mass index is 26.94 kg/m.Marland Kitchen Seen by dietician.  I agree with the assessment and plan as outlined below: Nutrition Status:        . Skin Assessment: I have examined the patient's skin and I agree with the wound assessment as performed by the wound care RN as outlined below:    Consultants:   Cardiology  Procedures:  None  Antimicrobials:  Anti-infectives (From admission, onward)    None         Subjective: Patient seen and examined.  She has no complaint.  Objective: Vitals:   02/21/23 1943 02/22/23 0051 02/22/23 0527 02/22/23 0705  BP: 115/73 122/78 124/78 121/87  Pulse: 78 73 73 66  Resp: Temp: 98 F (36.7 C) 98 F (36.7 C) 97.9 F (36.6 C) 97.9 F (36.6 C)  TempSrc: Oral Oral Oral Oral  SpO2: 99% 97% 97% 98%  Weight:   60.5 kg   Height:        Intake/Output Summary (Last 24 hours) at 02/22/2023 1031 Last data filed at 02/22/2023 0849 Gross per 24 hour  Intake 603 ml  Output 1700 ml  Net -1097 ml    Filed Weights   02/21/23 1520 02/22/23 0527  Weight: 62.2 kg 60.5 kg    Examination: General exam: Appears calm and comfortable  Respiratory system: Clear to auscultation. Respiratory effort normal. Cardiovascular system: S1 & S2 heard, RRR. No JVD, murmurs, rubs, gallops or clicks.  Trace pitting edema bilateral lower extremity. Gastrointestinal system: Abdomen is soft, distended, nontender with positive fluid shift. No organomegaly or masses felt. Normal bowel sounds heard. Central nervous system: Alert and oriented. No focal neurological deficits. Extremities: Symmetric 5 x 5 power. Skin: No rashes, lesions or ulcers.  Psychiatry: Judgement and insight appear normal. Mood & affect appropriate.   Data Reviewed: I have personally reviewed following labs and imaging studies  CBC: Recent Labs  Lab 02/20/23 1700 02/22/23 0600  WBC 5.1 4.2  NEUTROABS 3.8  --   HGB 12.0 11.0*  HCT 36.6 34.7*  MCV 66.3* 67.0*  PLT 327 300    Basic Metabolic Panel: Recent Labs  Lab 02/20/23 1700 02/21/23 0555 02/22/23 0600  NA 138 139 137  K 3.3* 3.6 3.5  CL 104 104 105  CO2 GLUCOSE 99 83 99  BUN CREATININE 0.83 0.79 1.01*  CALCIUM 8.9 8.7* 8.6*  MG  --  1.7  --     GFR: Estimated Creatinine Clearance: 54.5  mL/min (A) (by C-G formula based on SCr of 1.01 mg/dL (H)). Liver Function Tests: Recent Labs  Lab 02/20/23 1700 02/21/23 0555  AST 26 28  ALT 13 13  ALKPHOS 140* 124  BILITOT 1.7* 2.3*  PROT 7.9 7.4  ALBUMIN 2.8* 2.9*    No results for input(s): "LIPASE", "AMYLASE" in the last 168 hours. No results for input(s): "AMMONIA" in the last 168 hours. Coagulation Profile: Recent Labs  Lab 02/21/23 1515  INR 1.4*   Cardiac Enzymes: No results for input(s): "CKTOTAL", "CKMB", "CKMBINDEX", "TROPONINI" in the last 168 hours. BNP (last 3 results) Recent Labs    08/10/22 0937  PROBNP 1,151*    HbA1C: No results for input(s): "HGBA1C" in the last 72 hours. CBG: No results for input(s): "GLUCAP"  in the last 168 hours. Lipid Profile: No results for input(s): "CHOL", "HDL", "LDLCALC", "TRIG", "CHOLHDL", "LDLDIRECT" in the last 72 hours. Thyroid Function Tests: Recent Labs    02/21/23 1515  TSH 1.715  FREET4 1.22*   Anemia Panel: No results for input(s): "VITAMINB12", "FOLATE", "FERRITIN", "TIBC", "IRON", "RETICCTPCT" in the last 72 hours. Sepsis Labs: No results for input(s): "PROCALCITON", "LATICACIDVEN" in the last 168 hours.  Recent Results (from the past 240 hour(s))  Culture, body fluid w Gram Stain-bottle     Status: None (Preliminary result)   Collection Time: 02/21/23  9:35 AM   Specimen: Paracentesis  Result Value Ref Range Status   Specimen Description PARACENTESIS  Final   Special Requests NONE  Final   Culture   Final    NO GROWTH < 24 HOURS Performed at [facility], [address], [place], [place] [address]    Report Status PENDING  Incomplete  Gram stain     Status: None   Collection Time: 02/21/23  9:35 AM   Specimen: Paracentesis  Result Value Ref Range Status   Specimen Description PARACENTESIS  Final   Special Requests NONE  Final   Gram Stain   Final    NO WBC SEEN NO ORGANISMS SEEN Performed at [facility], [address]  [place], [place] [address]    Report Status 02/21/2023 FINAL  Final     Radiology Studies: IR Paracentesis  Result Date: 02/21/2023 INDICATION: History of chronic systolic heart failure with EF of 20-25% and HTN with complaint of shortness of breath and lower extremity edema. Patient found to have masses ascites on ultrasound exam. Request received for diagnostic and therapeutic paracentesis. EXAM: ULTRASOUND GUIDED DIAGNOSTIC AND THERAPEUTIC LEFT LOWER QUADRANT PARACENTESIS MEDICATIONS: 10 mL 1 % lidocaine COMPLICATIONS: None immediate. PROCEDURE: Informed written consent was obtained from the patient after a discussion of the risks, benefits and alternatives to treatment. A timeout was performed prior to the initiation of the procedure. Initial ultrasound scanning demonstrates a large amount of ascites within the left lower abdominal quadrant. The left lower abdomen was prepped and draped in the usual sterile fashion. 1% lidocaine was used for local anesthesia. Following this, a 19 gauge, 7-cm, Yueh catheter was introduced. An ultrasound image was saved for documentation purposes. The paracentesis was performed. The catheter was removed and a dressing was applied. The patient tolerated the procedure well without immediate post procedural complication. FINDINGS: A total of approximately 3.4 L of cloudy, golden colored fluid was removed. Samples were sent to the laboratory as requested by the clinical team. IMPRESSION: Successful ultrasound-guided paracentesis yielding 3.4 liters of peritoneal fluid. Read by: [name], AGNP-BC Electronically Signed   By: Acquanetta Belling M.D.   On: 02/21/2023 09:29   DG Chest 2 View  Result Date: 02/20/2023 CLINICAL DATA:  Shortness of breath x3 weeks. EXAM: CHEST - 2 VIEW COMPARISON:  February 17, 2016 FINDINGS: There is mild to moderate severity enlargement of the cardiac silhouette. Mild diffusely increased interstitial lung markings are seen. Mild linear atelectasis is noted  within the lateral aspect of the right lung base. There is a small right pleural effusion. No pneumothorax is identified. The visualized skeletal structures are unremarkable. IMPRESSION: 1. Cardiomegaly with mild interstitial edema. 2. Mild right basilar linear atelectasis. 3. Small right pleural effusion. Electronically Signed   By: Aram Candela M.D.   On: 02/20/2023 17:24    Scheduled Meds:  acetaZOLAMIDE ER  500 mg Oral Q12H  carvedilol  3.125 mg Oral BID WC   dapagliflozin propanediol  10 mg Oral Daily   furosemide  40 mg Intravenous Q12H   heparin  5,000 Units Subcutaneous Q8H   hydrALAZINE  25 mg Oral TID   potassium chloride  40 mEq Oral BID   sacubitril-valsartan  1 tablet Oral BID   sodium chloride flush  3 mL Intravenous Q12H   spironolactone  25 mg Oral Daily   Continuous Infusions:  sodium chloride     sodium chloride     [START ON 02/23/2023] sodium chloride     magnesium sulfate bolus IVPB       LOS: 2 days   Hughie Closs, MD [facility]  02/22/2023, 10:31 AM   *Please note that this is a verbal dictation therefore any spelling or grammatical errors are due to the "Dragon [employer] One" system interpretation.  Please page via Amion and do not message via secure chat for urgent patient care matters. Secure chat can be used for non urgent patient care matters.  How to contact the [facility] Attending or Consulting provider 7A - 7P or covering provider during after hours 7P -7A, for this patient?  Check the care team in [facility] and look for a) attending/consulting TR[facility] provider listed and b) the [facility] team listed. Page or secure chat 7A-7P. Log into [email or web address] and use [facility]'s universal password to access. If you do not have the password, please contact the hospital operator. Locate the [facility] provider you are looking for under [facility] and page to a number that you can be directly reached. If you still have difficulty reaching the provider, please page the  [facility] (Director on Call) for the Hospitalists listed on amion for assistance.

## 2023-02-23 ENCOUNTER — Encounter (HOSPITAL_COMMUNITY): Payer: Self-pay | Admitting: Cardiology

## 2023-02-23 ENCOUNTER — Other Ambulatory Visit (HOSPITAL_COMMUNITY): Payer: Self-pay

## 2023-02-23 DIAGNOSIS — N179 Acute kidney failure, unspecified: Secondary | ICD-10-CM

## 2023-02-23 DIAGNOSIS — I10 Essential (primary) hypertension: Secondary | ICD-10-CM | POA: Diagnosis not present

## 2023-02-23 DIAGNOSIS — I5043 Acute on chronic combined systolic (congestive) and diastolic (congestive) heart failure: Secondary | ICD-10-CM | POA: Diagnosis not present

## 2023-02-23 DIAGNOSIS — R188 Other ascites: Secondary | ICD-10-CM | POA: Diagnosis not present

## 2023-02-23 LAB — BASIC METABOLIC PANEL
Anion gap: 5 (ref 5–15)
BUN: 15 mg/dL (ref 6–20)
CO2: 21 mmol/L — ABNORMAL LOW (ref 22–32)
Calcium: 8.3 mg/dL — ABNORMAL LOW (ref 8.9–10.3)
Chloride: 112 mmol/L — ABNORMAL HIGH (ref 98–111)
Creatinine, Ser: 1.17 mg/dL — ABNORMAL HIGH (ref 0.44–1.00)
GFR, Estimated: 58 mL/min — ABNORMAL LOW (ref >60)
Glucose, Bld: 118 mg/dL — ABNORMAL HIGH (ref 70–99)
Potassium: 4.2 mmol/L (ref 3.5–5.1)
Sodium: 138 mmol/L (ref 135–145)

## 2023-02-23 LAB — CULTURE, BODY FLUID W GRAM STAIN -BOTTLE

## 2023-02-23 MED ORDER — ENOXAPARIN SODIUM 40 MG/0.4ML IJ SOSY
40.0000 mg | PREFILLED_SYRINGE | INTRAMUSCULAR | Status: DC
  Administered 2023-02-23 – 2023-02-25 (×3): 40 mg via SUBCUTANEOUS
  Filled 2023-02-23 (×3): qty 0.4

## 2023-02-23 MED ORDER — FUROSEMIDE 10 MG/ML IJ SOLN
40.0000 mg | Freq: Two times a day (BID) | INTRAMUSCULAR | Status: DC
  Administered 2023-02-23 – 2023-02-25 (×6): 40 mg via INTRAVENOUS
  Filled 2023-02-23 (×6): qty 4

## 2023-02-23 NOTE — Assessment & Plan Note (Addendum)
-  Echocardiogram with reduced LV systolic function with EF 20 to 25%, global hypokinesis, interventricular septum flattened in diastole, RV with moderate dilatation, RV cavity with severe dilatation, RVSP 42,7, RA with severe dilatation, moderate to severe TR -Excellent response to IV diuresis -Discharged home on Entresto, Toprol, spironolactone, Farxiga and Lasix 40 mg twice a day. -Continue close monitoring of patient's renal function and electrolytes -Continue outpatient follow-up with heart failure team. -Education regarding low-sodium diet and daily weights provided to patient. -Patient advised to maintain adequate hydration.

## 2023-02-23 NOTE — Progress Notes (Signed)
Progress Note   Patient: Mary Rios ZOX:[id number] DOB: 03-27-1976 DOA: 02/20/2023     3 DOS: the patient was seen and examined on 02/23/2023   Brief hospital course: Mrs. Straus was admitted with the working diagnosis of heart failure decompensation.   47 yo female with the past medical history of heart failure, and hypertension who presented with worsening abdominal edema. Reported 3 weeks of worsening lower extremity edema, and increased abdominal girth, rapid weight gain, but no dyspnea. She was evaluated by her GYN who recommended her to bee seen in the ED. On her initial physical examination her blood pressure was 169/112, HR 92, RR 19 and 02 saturation 99%, lungs with no wheezing or rales, heart with S1 and S2 present and rhythmic with positive murmurs, abdomen with distention with signs of ascites, positive lower extremity edema +++.   Na 138, K 3,3 CL 104, bicarbonate 24, glucose 99, bun 8 cr 0,83  BNP 2,176  Wbc 5.1 hgb 12.0 plt 327   Urine analysis SG 1,004, negative protein, moderate leukocytes, no bacteria.   Chest radiograph with cardiomegaly with bilateral hilar vascular congestion, bilateral interstitial infiltrates, with small right pleural effusion.  EKG 98 bpm, normal axis, normal intervals, sinus rhythm with poor  R R wave progression, no significant ST segment changes, negative T wave lead III and AvF, positive LVH with large S wave V2.   Patient was placed on IV furosemide for diuresis.  04/16 paracentesis, 3,4 L removed.  Echocardiogram with reduced LV systolic function.  04/17 cardiac catheterization with elevated pulmonary pressure.       Assessment and Plan: * Acute on chronic combined systolic and diastolic CHF (congestive heart failure) Echocardiogram with reduced LV systolic function with EF 20 to 25%, global hypokinesis, interventricular septum flattened in diastole, RV with moderate dilatation, RV cavity with severe dilatation, RVSP 42,7, RA with  severe dilatation, moderate to severe TR,   Pulmonary hypertension Acute on chronic core pulmonale.  Urine output is 1900 ml Systolic blood pressure is 109 to 101 mmHg.   Continue medical therapy with dapagliflozin, metoprolol, spironolactone and entresto. Continue diuresis with furosemide 40 mg IV q12    Benign essential HTN Continue blood pressure control with entresto, spironolactone   Ascites Sp paracentesis.  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  Continue to monitor ascites, may need another paracentesis.   AKI (acute kidney injury) Hypokalemia.   Renal function with serum cr at 1,17 with K at 4,2 and serum bicarbonate at 21. Na 138   Plan to continue diuresis, follow up renal function in am, avoid hypotension and nephrotoxic medications,         Subjective: Patient with no chest pain, dyspnea is improving, no orthopnea or PND   Physical Exam: Vitals:   02/23/23 0058 02/23/23 0515 02/23/23 0706 02/23/23 1618  BP: 104/72 115/81 109/72 101/77  Pulse: 74  73 63  Resp: Temp: 97.9 F (36.6 C) 97.7 F (36.5 C) 97.7 F (36.5 C) 97.8 F (36.6 C)  TempSrc: Oral Axillary Oral Oral  SpO2: 99% 98% 99% 98%  Weight:  57.9 kg    Height:       Neurology awake and alert ENT with mild pallor Cardiovascular with S1 and S2 present and rhythmic with no gallops or murmurs Positive JVD No lower extremity edema Respiratory with no rales or wheezing, with no rhonchi Trace lower extremity edema  Data Reviewed:    Family Communication: her daughter is at the bedside  Disposition: Status is: Inpatient Remains inpatient appropriate because: heart failure   Planned Discharge Destination: Home      Author: Coralie Keens, MD 02/23/2023 4:32 PM  For on call review [email or web address].

## 2023-02-23 NOTE — TOC Benefit Eligibility Note (Signed)
Patient Product/process development scientist completed.    The patient is currently admitted and upon discharge could be taking isosorbide-hydralazine (Bidil) 20--37.5 mg tablets.  The current 30 day co-pay is $40.00.   The patient is insured through [employer]   This test claim was processed through [employer]- copay amounts may vary at other pharmacies due to [employer], or as the patient moves through the different stages of their insurance plan.  Roland Earl, CPHT Pharmacy Patient Advocate Specialist [facility] Patient Advocate Team Direct Number: [phone]  Fax: [phone]

## 2023-02-23 NOTE — Progress Notes (Addendum)
Rounding Note    Patient Name: Mary Rios Date of Encounter: 02/23/2023  [facility] Cardiologist: Meriam Sprague, MD   Subjective   Cor angio and RHC yesterday without CAD, RA 15, PA 42/17, PAPI 1.6 wedge 9, PVR 3.  Brisk diuresis overnight -1.6L; Cr relatively stable  Patient feels well, breathing is stable.  No acute complaints.  Inpatient Medications    Scheduled Meds:  acetaZOLAMIDE ER  500 mg Oral Q12H   dapagliflozin propanediol  10 mg Oral Daily   heparin  5,000 Units Subcutaneous Q8H   hydrALAZINE  25 mg Oral TID   metoprolol succinate  12.5 mg Oral QHS   potassium chloride  40 mEq Oral BID   sacubitril-valsartan  1 tablet Oral BID   sodium chloride flush  3 mL Intravenous Q12H   sodium chloride flush  3 mL Intravenous Q12H   spironolactone  25 mg Oral Daily   Continuous Infusions:  sodium chloride     sodium chloride     PRN Meds: sodium chloride, sodium chloride, acetaminophen, ondansetron (ZOFRAN) IV, sodium chloride flush, sodium chloride flush   Vital Signs    Vitals:   02/22/23 2039 02/23/23 0058 02/23/23 0515 02/23/23 0706  BP:  104/72 115/81 109/72  Pulse: 75 74  73  Resp:  Temp:  97.9 F (36.6 C) 97.7 F (36.5 C) 97.7 F (36.5 C)  TempSrc:  Oral Axillary Oral  SpO2:  99% 98% 99%  Weight:   57.9 kg   Height:        Intake/Output Summary (Last 24 hours) at 02/23/2023 0851 Last data filed at 02/23/2023 0716 Gross per 24 hour  Intake 290.13 ml  Output 2300 ml  Net -2009.87 ml      02/23/2023    5:15 AM 02/22/2023    5:27 AM 02/21/2023    3:20 PM  Last 3 Weights  Weight (lbs) 127 lb 9.6 oz 133 lb 6.4 oz 137 lb 2 oz  Weight (kg) 57.879 kg 60.51 kg 62.2 kg      Telemetry    Sinus Rhythm - Personally Reviewed  ECG    No new tracing this morning  Physical Exam   GEN: No acute distress.   HEENT:  MMM, +TR pulsations, no scleral icterus Cardiac: RRR, with 2/6 systolic murmur Respiratory: Clear to  auscultation bilaterally. GI: Soft, nontender, distended  MS: No edema; No deformity. Neuro:  Nonfocal  Vasc:  +2 radial pulses, trace edema, warm and well perfused  Labs    High Sensitivity Troponin:   Recent Labs  Lab 02/20/23 1700  TROPONINIHS 13     Chemistry Recent Labs  Lab 02/20/23 1700 02/21/23 0555 02/22/23 0600 02/22/23 1433 02/22/23 1436 02/22/23 1438 02/23/23 0045  NA 138 139 137   < > 141 141 138  K 3.3* 3.6 3.5   < > 3.9 3.8 4.2  CL 104 104 105  --   --   --  112*  CO2 --   --   --  21*  GLUCOSE 99 83 99  --   --   --  118*  BUN --   --   --  15  CREATININE 0.83 0.79 1.01*  --   --   --  1.17*  CALCIUM 8.9 8.7* 8.6*  --   --   --  8.3*  MG  --  1.7  --   --   --   --   --  PROT 7.9 7.4  --   --   --   --   --   ALBUMIN 2.8* 2.9*  --   --   --   --   --   AST 26 28  --   --   --   --   --   ALT 13 13  --   --   --   --   --   ALKPHOS 140* 124  --   --   --   --   --   BILITOT 1.7* 2.3*  --   --   --   --   --   GFRNONAA >60 >60 >60  --   --   --  58*  ANIONGAP 10 12 7   --   --   --  5   < > = values in this interval not displayed.    Lipids No results for input(s): "CHOL", "TRIG", "HDL", "LABVLDL", "LDLCALC", "CHOLHDL" in the last 168 hours.  Hematology Recent Labs  Lab 02/20/23 1700 02/22/23 0600 02/22/23 1433 02/22/23 1436 02/22/23 1438  WBC 5.1 4.2  --   --   --   RBC 5.52* 5.18*  --   --   --   HGB 12.0 11.0* 12.6 12.9 13.3  HCT 36.6 34.7* 37.0 38.0 39.0  MCV 66.3* 67.0*  --   --   --   MCH 21.7* 21.2*  --   --   --   MCHC 32.8 31.7  --   --   --   RDW 27.1* 26.5*  --   --   --   PLT 327 300  --   --   --    Thyroid  Recent Labs  Lab 02/21/23 1515  TSH 1.715  FREET4 1.22*    BNP Recent Labs  Lab 02/20/23 1700  BNP 2,176.0*    DDimer No results for input(s): "DDIMER" in the last 168 hours.   Radiology    CARDIAC CATHETERIZATION  Result Date: 02/22/2023   LV end diastolic pressure is normal. Normal  coronary anatomy. Elevated RA pressure 19/18, mean 15 mm Hg Mildly elevated pulmonary pressure. 42/17 mean 26 mm Hg Normal LV filling pressures. PCWP 12/14, mean 9 mm Hg. LVEDP 15 mm Hg Normal cardiac output. 5.52 L/min, index 3.56. Plan: medical therapy   ECHOCARDIOGRAM COMPLETE  Result Date: 02/22/2023    ECHOCARDIOGRAM REPORT   Patient Name:   Mary Rios Date of Exam: 02/22/2023 Medical Rec #:  [id number]       Height:       59.0 in Accession #:    [id number]      Weight:       133.4 lb Date of Birth:  07/21/1976        BSA:          1.553 m Patient Age:    47 years        BP:           124/78 mmHg Patient Gender: F               HR:           68 bpm. Exam Location:  Inpatient Procedure: 2D Echo, Cardiac Doppler, Color Doppler and Intracardiac            Opacification Agent Indications:    CHF-Acute Systolic I50.21  History:        Patient has prior history of Echocardiogram examinations, most  recent 08/11/2022. CHF; Risk Factors:Hypertension.  Sonographer:    Lucendia Herrlich Referring Phys: [id number] ERIC CHEN IMPRESSIONS  1. Left ventricular ejection fraction, by estimation, is 20 to 25%. The left ventricle has severely decreased function. The left ventricle demonstrates global hypokinesis. The left ventricular internal cavity size was mildly dilated. Left ventricular diastolic parameters are consistent with Grade III diastolic dysfunction (restrictive). Elevated left atrial pressure. There is the interventricular septum is flattened in diastole ('D' shaped left ventricle), consistent with right ventricular volume overload.  2. Right ventricular systolic function is moderately reduced. The right ventricular size is severely enlarged. There is mildly elevated pulmonary artery systolic pressure. The estimated right ventricular systolic pressure is 42.7 mmHg.  3. Left atrial size was moderately dilated.  4. Right atrial size was severely dilated.  5. The mitral valve is normal in structure.  Trivial mitral valve regurgitation.  6. Tricuspid valve regurgitation is moderate to severe.  7. The aortic valve is tricuspid. Aortic valve regurgitation is not visualized.  8. The inferior vena cava is dilated in size with <50% respiratory variability, suggesting right atrial pressure of 15 mmHg. Comparison(s): Prior images reviewed side by side. The left ventricular systolic function is unchanged. The left ventricular diastolic function is significantly worse. There is evidence of increased right and left heart filling pressures compared to the previous study. FINDINGS  Left Ventricle: Left ventricular ejection fraction, by estimation, is 20 to 25%. The left ventricle has severely decreased function. The left ventricle demonstrates global hypokinesis. Definity contrast agent was given IV to delineate the left ventricular endocardial borders. The left ventricular internal cavity size was mildly dilated. There is no left ventricular hypertrophy. The interventricular septum is flattened in diastole ('D' shaped left ventricle), consistent with right ventricular volume overload. Left ventricular diastolic parameters are consistent with Grade III diastolic dysfunction (restrictive). Elevated left atrial pressure. Right Ventricle: The right ventricular size is severely enlarged. No increase in right ventricular wall thickness. Right ventricular systolic function is moderately reduced. There is mildly elevated pulmonary artery systolic pressure. The tricuspid regurgitant velocity is 2.63 m/s, and with an assumed right atrial pressure of 15 mmHg, the estimated right ventricular systolic pressure is 42.7 mmHg. Left Atrium: Left atrial size was moderately dilated. Right Atrium: Right atrial size was severely dilated. Pericardium: Trivial pericardial effusion is present. Mitral Valve: The mitral valve is normal in structure. Trivial mitral valve regurgitation. Tricuspid Valve: The tricuspid valve is normal in structure.  Tricuspid valve regurgitation is moderate to severe. Aortic Valve: The aortic valve is tricuspid. Aortic valve regurgitation is not visualized. Aortic valve peak gradient measures 8.0 mmHg. Pulmonic Valve: The pulmonic valve was normal in structure. Pulmonic valve regurgitation is trivial. Aorta: The aortic root and ascending aorta are structurally normal, with no evidence of dilitation. Venous: The inferior vena cava is dilated in size with less than 50% respiratory variability, suggesting right atrial pressure of 15 mmHg. IAS/Shunts: No atrial level shunt detected by color flow Doppler.  LEFT VENTRICLE PLAX 2D LVIDd:         5.63 cm      Diastology LVIDs:         4.96 cm      LV e' medial:    4.38 cm/s LV PW:         0.80 cm      LV E/e' medial:  26.3 LV IVS:        1.00 cm      LV e' lateral:   11.20 cm/s  LVOT diam:     2.00 cm      LV E/e' lateral: 10.3 LV SV:         63 LV SV Index:   40 LVOT Area:     3.14 cm  LV Volumes (MOD) LV vol d, MOD A2C: 157.0 ml LV vol d, MOD A4C: 131.0 ml LV vol s, MOD A2C: 115.0 ml LV vol s, MOD A4C: 86.9 ml LV SV MOD A2C:     42.0 ml LV SV MOD A4C:     131.0 ml LV SV MOD BP:      45.9 ml RIGHT VENTRICLE             IVC RV S prime:     10.70 cm/s  IVC diam: 2.00 cm TAPSE (M-mode): 1.3 cm LEFT ATRIUM             Index        RIGHT ATRIUM           Index LA diam:        4.70 cm 3.03 cm/m   RA Area:     23.70 cm LA Vol (A2C):   65.9 ml 42.44 ml/m  RA Volume:   85.80 ml  55.26 ml/m LA Vol (A4C):   50.4 ml 32.46 ml/m LA Biplane Vol: 58.7 ml 37.80 ml/m  AORTIC VALVE                 PULMONIC VALVE AV Area (Vmax): 2.66 cm     PR End Diast Vel: 4.67 msec AV Vmax:        141.00 cm/s AV Peak Grad:   8.0 mmHg LVOT Vmax:      119.50 cm/s LVOT Vmean:     78.650 cm/s LVOT VTI:       0.200 m  AORTA Ao Root diam: 2.60 cm Ao Asc diam:  3.00 cm MITRAL VALVE                TRICUSPID VALVE MV Area (PHT): 5.27 cm     TR Peak grad:   27.7 mmHg MV Decel Time: 144 msec     TR Vmax:        263.00  cm/s MV E velocity: 115.00 cm/s MV A velocity: 52.40 cm/s   SHUNTS MV E/A ratio:  2.19         Systemic VTI:  0.20 m                             Systemic Diam: 2.00 cm Rachelle Hora Croitoru MD Electronically signed by Thurmon Fair MD Signature Date/Time: 02/22/2023/11:10:58 AM    Final    IR Paracentesis  Result Date: 02/21/2023 INDICATION: History of chronic systolic heart failure with EF of 20-25% and HTN with complaint of shortness of breath and lower extremity edema. Patient found to have masses ascites on ultrasound exam. Request received for diagnostic and therapeutic paracentesis. EXAM: ULTRASOUND GUIDED DIAGNOSTIC AND THERAPEUTIC LEFT LOWER QUADRANT PARACENTESIS MEDICATIONS: 10 mL 1 % lidocaine COMPLICATIONS: None immediate. PROCEDURE: Informed written consent was obtained from the patient after a discussion of the risks, benefits and alternatives to treatment. A timeout was performed prior to the initiation of the procedure. Initial ultrasound scanning demonstrates a large amount of ascites within the left lower abdominal quadrant. The left lower abdomen was prepped and draped in the usual sterile fashion. 1% lidocaine was used for local anesthesia. Following this, a 19 gauge, 7-cm,  Yueh catheter was introduced. An ultrasound image was saved for documentation purposes. The paracentesis was performed. The catheter was removed and a dressing was applied. The patient tolerated the procedure well without immediate post procedural complication. FINDINGS: A total of approximately 3.4 L of cloudy, golden colored fluid was removed. Samples were sent to the laboratory as requested by the clinical team. IMPRESSION: Successful ultrasound-guided paracentesis yielding 3.4 liters of peritoneal fluid. Read by: [name], AGNP-BC Electronically Signed   By: Acquanetta Belling M.D.   On: 02/21/2023 09:29    Cardiac Studies   Cath 4/17 Normal coronary anatomy. Elevated RA pressure 19/18, mean 15 mm Hg Mildly elevated  pulmonary pressure. 42/17 mean 26 mm Hg Normal LV filling pressures. PCWP 12/14, mean 9 mm Hg. LVEDP 15 mm Hg Normal cardiac output. 5.52 L/min, index 3.56.  TTE 4/17  1. Left ventricular ejection fraction, by estimation, is 20 to 25%. The  left ventricle has severely decreased function. The left ventricle  demonstrates global hypokinesis. The left ventricular internal cavity size  was mildly dilated. Left ventricular  diastolic parameters are consistent with Grade III diastolic dysfunction  (restrictive). Elevated left atrial pressure. There is the  interventricular septum is flattened in diastole ('D' shaped left  ventricle), consistent with right ventricular volume  overload.   2. Right ventricular systolic function is moderately reduced. The right  ventricular size is severely enlarged. There is mildly elevated pulmonary  artery systolic pressure. The estimated right ventricular systolic  pressure is 42.7 mmHg.   3. Left atrial size was moderately dilated.   4. Right atrial size was severely dilated.   5. The mitral valve is normal in structure. Trivial mitral valve  regurgitation.   6. Tricuspid valve regurgitation is moderate to severe.   7. The aortic valve is tricuspid. Aortic valve regurgitation is not  visualized.   8. The inferior vena cava is dilated in size with <50% respiratory  variability, suggesting right atrial pressure of 15 mmHg.   Patient Profile     47 y.o. female a hx of HFrEF in 2023 on GDMT, HTN, uterine fibroids who was seen 02/21/2023 for the evaluation of CHF at the request of Dr. Jacqulyn Bath.   Assessment & Plan    HFrEF BiV HF Severe TR Cath yesterday without CAD c/w NICM.  RA pressures elevated and PAPI mildly diminished; continue diuresis to unload RV;  D/C diamox as bicarb ok, restart lasix 40mg  IV BID.  Will change to PO soon.  BP 100s but ok for degree of cardiomyopathy.  Continue coreg 3.125mg  BID, entresto 97-103 BID, spiro 25mg  daily,  farxiga;   D/C hydralazine today and will plan for BiDil tomorrow.  Hopefully home in the next 48hours.  HTN Well afterload reduced on current meds, will d/c hydralzine and try to start BiDil tomorrow  Ascites s/p paracentesis with 3.4L removed 3 weeks prior   Uterine fibroids -- follows with OBGYN -- per primary   For questions or updates, please contact [facility] Please consult [email or web address] for contact info under        Signed, Orbie Pyo, MD  02/23/2023, 8:51 AM

## 2023-02-23 NOTE — Hospital Course (Addendum)
Mrs. Mings was admitted with the working diagnosis of heart failure decompensation.   47 yo female with the past medical history of heart failure, and hypertension who presented with worsening abdominal edema. Reported 3 weeks of worsening lower extremity edema, and increased abdominal girth, rapid weight gain, but no dyspnea. She was evaluated by her GYN who recommended her to bee seen in the ED. On her initial physical examination her blood pressure was 169/112, HR 92, RR 19 and 02 saturation 99%, lungs with no wheezing or rales, heart with S1 and S2 present and rhythmic with positive murmurs, abdomen with distention with signs of ascites, positive lower extremity edema +++.   Na 138, K 3,3 CL 104, bicarbonate 24, glucose 99, bun 8 cr 0,83  BNP 2,176  Wbc 5.1 hgb 12.0 plt 327   Urine analysis SG 1,004, negative protein, moderate leukocytes, no bacteria.   Chest radiograph with cardiomegaly with bilateral hilar vascular congestion, bilateral interstitial infiltrates, with small right pleural effusion.  EKG 98 bpm, normal axis, normal intervals, sinus rhythm with poor  R R wave progression, no significant ST segment changes, negative T wave lead III and AvF, positive LVH with large S wave V2.   Patient was placed on IV furosemide for diuresis.  04/16 paracentesis, 3,4 L removed.  Echocardiogram with reduced LV systolic function.  04/17 cardiac catheterization with elevated pulmonary pressure.   04/19 continue diuresing. Cardiac MRI.  04/20 volume status is improving

## 2023-02-23 NOTE — Assessment & Plan Note (Addendum)
Hypokalemia.   Volume status is improving, renal function with serum cr at 1,15 with K at 4,1 and serum bicarbonate at 21. Na 138 and Mg 2,2   Plan to continue diuresis, follow up renal function in am, avoid hypotension and nephrotoxic medications,

## 2023-02-24 ENCOUNTER — Inpatient Hospital Stay (HOSPITAL_COMMUNITY): Payer: BC Managed Care – PPO

## 2023-02-24 DIAGNOSIS — I361 Nonrheumatic tricuspid (valve) insufficiency: Secondary | ICD-10-CM | POA: Diagnosis not present

## 2023-02-24 DIAGNOSIS — I10 Essential (primary) hypertension: Secondary | ICD-10-CM | POA: Diagnosis not present

## 2023-02-24 DIAGNOSIS — R188 Other ascites: Secondary | ICD-10-CM | POA: Diagnosis not present

## 2023-02-24 DIAGNOSIS — I3139 Other pericardial effusion (noninflammatory): Secondary | ICD-10-CM

## 2023-02-24 DIAGNOSIS — I5043 Acute on chronic combined systolic (congestive) and diastolic (congestive) heart failure: Secondary | ICD-10-CM | POA: Diagnosis not present

## 2023-02-24 DIAGNOSIS — N179 Acute kidney failure, unspecified: Secondary | ICD-10-CM | POA: Diagnosis not present

## 2023-02-24 LAB — BASIC METABOLIC PANEL
Anion gap: 10 (ref 5–15)
BUN: 20 mg/dL (ref 6–20)
CO2: 21 mmol/L — ABNORMAL LOW (ref 22–32)
Calcium: 8.8 mg/dL — ABNORMAL LOW (ref 8.9–10.3)
Chloride: 107 mmol/L (ref 98–111)
Creatinine, Ser: 1.15 mg/dL — ABNORMAL HIGH (ref 0.44–1.00)
GFR, Estimated: 59 mL/min — ABNORMAL LOW (ref >60)
Glucose, Bld: 115 mg/dL — ABNORMAL HIGH (ref 70–99)
Potassium: 4.1 mmol/L (ref 3.5–5.1)
Sodium: 138 mmol/L (ref 135–145)

## 2023-02-24 LAB — MAGNESIUM: Magnesium: 2.2 mg/dL (ref 1.7–2.4)

## 2023-02-24 LAB — LIPOPROTEIN A (LPA): Lipoprotein (a): 99.1 nmol/L — ABNORMAL HIGH (ref <75.0)

## 2023-02-24 LAB — CULTURE, BODY FLUID W GRAM STAIN -BOTTLE

## 2023-02-24 MED ORDER — GADOBUTROL 1 MMOL/ML IV SOLN
5.0000 mL | Freq: Once | INTRAVENOUS | Status: AC | PRN
  Administered 2023-02-24: 5 mL via INTRAVENOUS

## 2023-02-24 MED ORDER — SPIRONOLACTONE 25 MG PO TABS
25.0000 mg | ORAL_TABLET | Freq: Every day | ORAL | Status: DC
  Administered 2023-02-24 – 2023-02-25 (×2): 25 mg via ORAL
  Filled 2023-02-24 (×2): qty 1

## 2023-02-24 MED ORDER — GADOBUTROL 1 MMOL/ML IV SOLN: 8.0000 mL | Freq: Once | INTRAVENOUS | Status: DC | PRN

## 2023-02-24 NOTE — TOC Progression Note (Signed)
Transition of Care [facility]) - Progression Note    Patient Details  Name: Mary Rios MRN: [id number] Date of Birth: 1976-08-13  Transition of Care [facility] CM/SW Contact  Leone Haven, RN Phone Number: 02/24/2023, 4:23 PM  Clinical Narrative:    From home with two daughters , indep, still working at [employer]. She is indep, does not have a scale but states she can get one, she checks her bp daily, she eats a low sodium diet explained importance of staying away from too much salty foods. She has PCP, Dr. Dareen Piano, she states she drove here to the hospital but depends on how she feels at dc if she will drive or call someone to transport her home. She has no HH needs.  TOC following.        Expected Discharge Plan and Services                                               Social Determinants of Health (SDOH) Interventions SDOH Screenings   Food Insecurity: Food Insecurity Present (02/21/2023)  Housing: Low Risk  (02/21/2023)  Transportation Needs: No Transportation Needs (02/21/2023)  Utilities: Not At Risk (02/21/2023)  Tobacco Use: Low Risk  (02/23/2023)    Readmission Risk Interventions     No data to display

## 2023-02-24 NOTE — Progress Notes (Signed)
Progress Note   Patient: BRIEANN OSINSKI NWG:[id number] DOB: [birth year] DOA: 02/20/2023     4 DOS: the patient was seen and examined on 02/24/2023   Brief hospital course: Mrs. Sills was admitted with the working diagnosis of heart failure decompensation.   47 yo female with the past medical history of heart failure, and hypertension who presented with worsening abdominal edema. Reported 3 weeks of worsening lower extremity edema, and increased abdominal girth, rapid weight gain, but no dyspnea. She was evaluated by her GYN who recommended her to bee seen in the ED. On her initial physical examination her blood pressure was 169/112, HR 92, RR 19 and 02 saturation 99%, lungs with no wheezing or rales, heart with S1 and S2 present and rhythmic with positive murmurs, abdomen with distention with signs of ascites, positive lower extremity edema +++.   Na 138, K 3,3 CL 104, bicarbonate 24, glucose 99, bun 8 cr 0,83  BNP 2,176  Wbc 5.1 hgb 12.0 plt 327   Urine analysis SG 1,004, negative protein, moderate leukocytes, no bacteria.   Chest radiograph with cardiomegaly with bilateral hilar vascular congestion, bilateral interstitial infiltrates, with small right pleural effusion.  EKG 98 bpm, normal axis, normal intervals, sinus rhythm with poor  R R wave progression, no significant ST segment changes, negative T wave lead III and AvF, positive LVH with large S wave V2.   Patient was placed on IV furosemide for diuresis.  04/16 paracentesis, 3,4 L removed.  Echocardiogram with reduced LV systolic function.  04/17 cardiac catheterization with elevated pulmonary pressure.   04/19 continue diuresing.   Assessment and Plan: * Acute on chronic combined systolic and diastolic CHF (congestive heart failure) Echocardiogram with reduced LV systolic function with EF 20 to 25%, global hypokinesis, interventricular septum flattened in diastole, RV with moderate dilatation, RV cavity with severe dilatation,  RVSP 42,7, RA with severe dilatation, moderate to severe TR,   Pulmonary hypertension Acute on chronic core pulmonale.  Urine output is 4,200 ml Systolic blood pressure is 109 to 111 mmHg.   Continue medical therapy with dapagliflozin, metoprolol, spironolactone and entresto. Continue diuresis with furosemide 40 mg IV q12  Patient may benefit from cardiac MRI for work up for non ischemic cardiomyopathy.   Benign essential HTN Continue blood pressure control with entresto, spironolactone   Ascites Sp paracentesis.  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  Continue to monitor ascites, may need another paracentesis.   AKI (acute kidney injury) Hypokalemia.   Volume status is improving, renal function with serum cr at 1,15 with K at 4,1 and serum bicarbonate at 21. Na 138 and Mg 2,2   Plan to continue diuresis, follow up renal function in am, avoid hypotension and nephrotoxic medications,         Subjective: patient with no chest pain, dyspnea and abdominal distention are improving,   Physical Exam: Vitals:   02/23/23 2156 02/23/23 2356 02/24/23 0422 02/24/23 1148  BP: 110/69 105/70 125/82 111/75  Pulse: 79  74 72  Resp:  17  18  Temp:  98.6 F (37 C) 97.8 F (36.6 C) 97.8 F (36.6 C)  TempSrc:  Oral Oral Oral  SpO2:  100% 98% 100%  Weight:   53.1 kg   Height:       Neurology awake and alert ENT with mild pallor Cardiovascular with S1 and S2 present and rhythmic with no gallops, rubs or murmurs Moderate JVD Trace pitting lower extremity edema Respiratory with rales at bases with no wheezing or rhonchi  Abdomen with distention, improved from yesterday  Data Reviewed:    Family Communication: her daughter is at the bedside   Disposition: Status is: Inpatient Remains inpatient appropriate because: heart failure   Planned Discharge Destination: Home     Author: Coralie Keens, MD 02/24/2023 12:29 PM  For on call review [email or web address].

## 2023-02-24 NOTE — Progress Notes (Signed)
Rounding Note    Patient Name: Mary Rios Date of Encounter: 02/24/2023  [facility] Cardiologist: Meriam Sprague, MD   Subjective   No acute events overnight.  Brisk diuresis, net negative 3.7L and BUN/Cr stable  Inpatient Medications    Scheduled Meds:  dapagliflozin propanediol  10 mg Oral Daily   enoxaparin (LOVENOX) injection  40 mg Subcutaneous Q24H   furosemide  40 mg Intravenous BID   metoprolol succinate  12.5 mg Oral QHS   sacubitril-valsartan  1 tablet Oral BID   sodium chloride flush  3 mL Intravenous Q12H   sodium chloride flush  3 mL Intravenous Q12H   spironolactone  25 mg Oral Daily   Continuous Infusions:  sodium chloride     sodium chloride     PRN Meds: sodium chloride, sodium chloride, acetaminophen, ondansetron (ZOFRAN) IV, sodium chloride flush, sodium chloride flush   Vital Signs    Vitals:   02/23/23 1955 02/23/23 2156 02/23/23 2356 02/24/23 0422  BP: 102/63 110/69 105/70 125/82  Pulse:  79  74  Resp: 18  17   Temp: 98.1 F (36.7 C)  98.6 F (37 C) 97.8 F (36.6 C)  TempSrc: Oral  Oral Oral  SpO2: 100%  100% 98%  Weight:    53.1 kg  Height:        Intake/Output Summary (Last 24 hours) at 02/24/2023 1052 Last data filed at 02/24/2023 0424 Gross per 24 hour  Intake 177 ml  Output 3800 ml  Net -3623 ml      02/24/2023    4:22 AM 02/23/2023    5:15 AM 02/22/2023    5:27 AM  Last 3 Weights  Weight (lbs) 117 lb 1.6 oz 127 lb 9.6 oz 133 lb 6.4 oz  Weight (kg) 53.116 kg 57.879 kg 60.51 kg      Telemetry    Sinus Rhythm; NSVT x 1 - Personally Reviewed  ECG    No new tracing this morning  Physical Exam   GEN: No acute distress.   HEENT:  MMM, +TR pulsations, no scleral icterus Cardiac: RRR, with 2/6 systolic murmur Respiratory: Clear to auscultation bilaterally. GI: Soft, nontender, distended and less firm today MS: No edema; No deformity. Neuro:  Nonfocal  Vasc:  +2 radial pulses, trace edema, warm  and well perfused  Labs    High Sensitivity Troponin:   Recent Labs  Lab 02/20/23 1700  TROPONINIHS 13     Chemistry Recent Labs  Lab 02/20/23 1700 02/21/23 0555 02/22/23 0600 02/22/23 1433 02/22/23 1438 02/23/23 0045 02/24/23 0106  NA 138 139 137   < > 141 138 138  K 3.3* 3.6 3.5   < > 3.8 4.2 4.1  CL 104 104 105  --   --  112* 107  CO2 --   --  21* 21*  GLUCOSE 99 83 99  --   --  118* 115*  BUN --   --  15 20  CREATININE 0.83 0.79 1.01*  --   --  1.17* 1.15*  CALCIUM 8.9 8.7* 8.6*  --   --  8.3* 8.8*  MG  --  1.7  --   --   --   --  2.2  PROT 7.9 7.4  --   --   --   --   --   ALBUMIN 2.8* 2.9*  --   --   --   --   --  AST 26 28  --   --   --   --   --   ALT 13 13  --   --   --   --   --   ALKPHOS 140* 124  --   --   --   --   --   BILITOT 1.7* 2.3*  --   --   --   --   --   GFRNONAA >60 >60 >60  --   --  58* 59*  ANIONGAP --   --  5 10   < > = values in this interval not displayed.    Lipids No results for input(s): "CHOL", "TRIG", "HDL", "LABVLDL", "LDLCALC", "CHOLHDL" in the last 168 hours.  Hematology Recent Labs  Lab 02/20/23 1700 02/22/23 0600 02/22/23 1433 02/22/23 1436 02/22/23 1438  WBC 5.1 4.2  --   --   --   RBC 5.52* 5.18*  --   --   --   HGB 12.0 11.0* 12.6 12.9 13.3  HCT 36.6 34.7* 37.0 38.0 39.0  MCV 66.3* 67.0*  --   --   --   MCH 21.7* 21.2*  --   --   --   MCHC 32.8 31.7  --   --   --   RDW 27.1* 26.5*  --   --   --   PLT 327 300  --   --   --    Thyroid  Recent Labs  Lab 02/21/23 1515  TSH 1.715  FREET4 1.22*    BNP Recent Labs  Lab 02/20/23 1700  BNP 2,176.0*    DDimer No results for input(s): "DDIMER" in the last 168 hours.   Radiology    CARDIAC CATHETERIZATION  Result Date: 02/22/2023   LV end diastolic pressure is normal. Normal coronary anatomy. Elevated RA pressure 19/18, mean 15 mm Hg Mildly elevated pulmonary pressure. 42/17 mean 26 mm Hg Normal LV filling pressures. PCWP 12/14, mean  9 mm Hg. LVEDP 15 mm Hg Normal cardiac output. 5.52 L/min, index 3.56. Plan: medical therapy    Cardiac Studies   Cath 4/17 Normal coronary anatomy. Elevated RA pressure 19/18, mean 15 mm Hg Mildly elevated pulmonary pressure. 42/17 mean 26 mm Hg Normal LV filling pressures. PCWP 12/14, mean 9 mm Hg. LVEDP 15 mm Hg Normal cardiac output. 5.52 L/min, index 3.56.  TTE 4/17  1. Left ventricular ejection fraction, by estimation, is 20 to 25%. The  left ventricle has severely decreased function. The left ventricle  demonstrates global hypokinesis. The left ventricular internal cavity size  was mildly dilated. Left ventricular  diastolic parameters are consistent with Grade III diastolic dysfunction  (restrictive). Elevated left atrial pressure. There is the  interventricular septum is flattened in diastole ('D' shaped left  ventricle), consistent with right ventricular volume  overload.   2. Right ventricular systolic function is moderately reduced. The right  ventricular size is severely enlarged. There is mildly elevated pulmonary  artery systolic pressure. The estimated right ventricular systolic  pressure is 42.7 mmHg.   3. Left atrial size was moderately dilated.   4. Right atrial size was severely dilated.   5. The mitral valve is normal in structure. Trivial mitral valve  regurgitation.   6. Tricuspid valve regurgitation is moderate to severe.   7. The aortic valve is tricuspid. Aortic valve regurgitation is not  visualized.   8. The inferior vena cava is dilated in size with <50% respiratory  variability, suggesting right atrial pressure of 15 mmHg.   Patient Profile     47 y.o. female a hx of HFrEF in 2023 on GDMT, HTN, uterine fibroids who was seen 02/21/2023 for the evaluation of CHF at the request of Dr. Jacqulyn Bath.   Assessment & Plan    HFrEF BiV HF Severe TR Cath without CAD c/w NICM.  RA pressures elevated and PAPI mildly diminished on cath; continue lasix 40mg  IV  BID to unload RV and change to PO when BUN/Cr start rising.  Continue Toprol 12.5mg  QPM and change spiro 25 to QPM dosing as this may allow [place] to start BiDil tomorrow.  Given severe LV dysfunction (EF 20%), BP 80-110 is fine as long as patient not symptomatic.  Cont [place].  Will see if CMR can be done today (or Monday if still here).  OTW CMR as outpatient.  HTN Well afterload reduced on current meds, will try to start BiDil tomorrow  Ascites s/p paracentesis with 3.4L removed 3 weeks prior; unclear etiology; improved today so likely due to RV dysfunction/overload.  Continue aggressive diuresis.  Uterine fibroids -- follows with OBGYN -- per primary  NSVT: Watch K, Mg; cont Toprol  For questions or updates, please contact [facility] Please consult [email or web address] for contact info under        Signed, Orbie Pyo, MD  02/24/2023, 10:52 AM

## 2023-02-25 DIAGNOSIS — I10 Essential (primary) hypertension: Secondary | ICD-10-CM | POA: Diagnosis not present

## 2023-02-25 DIAGNOSIS — N179 Acute kidney failure, unspecified: Secondary | ICD-10-CM | POA: Diagnosis not present

## 2023-02-25 DIAGNOSIS — I5043 Acute on chronic combined systolic (congestive) and diastolic (congestive) heart failure: Secondary | ICD-10-CM | POA: Diagnosis not present

## 2023-02-25 DIAGNOSIS — R188 Other ascites: Secondary | ICD-10-CM | POA: Diagnosis not present

## 2023-02-25 LAB — CULTURE, BODY FLUID W GRAM STAIN -BOTTLE

## 2023-02-25 LAB — MAGNESIUM: Magnesium: 2.3 mg/dL (ref 1.7–2.4)

## 2023-02-25 LAB — BASIC METABOLIC PANEL
Anion gap: 9 (ref 5–15)
BUN: 18 mg/dL (ref 6–20)
CO2: 25 mmol/L (ref 22–32)
Calcium: 8.8 mg/dL — ABNORMAL LOW (ref 8.9–10.3)
Chloride: 102 mmol/L (ref 98–111)
Creatinine, Ser: 1.18 mg/dL — ABNORMAL HIGH (ref 0.44–1.00)
GFR, Estimated: 57 mL/min — ABNORMAL LOW (ref >60)
Glucose, Bld: 115 mg/dL — ABNORMAL HIGH (ref 70–99)
Potassium: 3.1 mmol/L — ABNORMAL LOW (ref 3.5–5.1)
Sodium: 136 mmol/L (ref 135–145)

## 2023-02-25 MED ORDER — POTASSIUM CHLORIDE CRYS ER 20 MEQ PO TBCR
40.0000 meq | EXTENDED_RELEASE_TABLET | Freq: Once | ORAL | Status: AC
  Administered 2023-02-25: 40 meq via ORAL
  Filled 2023-02-25: qty 2

## 2023-02-25 MED ORDER — ORAL CARE MOUTH RINSE: 15.0000 mL | OROMUCOSAL | Status: DC | PRN

## 2023-02-25 NOTE — Progress Notes (Signed)
Rounding Note    Patient Name: Mary Rios Date of Encounter: 02/25/2023  [facility] Cardiologist: Meriam Sprague, MD   Subjective   No acute events overnight.  Brisk diuresis, net negative -8.65L and BUN/Cr stable  Inpatient Medications    Scheduled Meds:  dapagliflozin propanediol  10 mg Oral Daily   enoxaparin (LOVENOX) injection  40 mg Subcutaneous Q24H   furosemide  40 mg Intravenous BID   metoprolol succinate  12.5 mg Oral QHS   sacubitril-valsartan  1 tablet Oral BID   sodium chloride flush  3 mL Intravenous Q12H   sodium chloride flush  3 mL Intravenous Q12H   spironolactone  25 mg Oral QHS   Continuous Infusions:  sodium chloride     sodium chloride     PRN Meds: sodium chloride, sodium chloride, acetaminophen, gadobutrol, ondansetron (ZOFRAN) IV, sodium chloride flush, sodium chloride flush   Vital Signs    Vitals:   02/25/23 0014 02/25/23 0404 02/25/23 0821 02/25/23 1100  BP: 119/73 111/66 118/67 106/71  Pulse: 77 73 65 75  Resp: 17 17 17 18   Temp: 98.2 F (36.8 C) 98.1 F (36.7 C) 98.3 F (36.8 C) 98.1 F (36.7 C)  TempSrc: Oral Oral Oral   SpO2: 99% 100% 100% 100%  Weight:  48.6 kg    Height:        Intake/Output Summary (Last 24 hours) at 02/25/2023 1330 Last data filed at 02/25/2023 1103 Gross per 24 hour  Intake 960 ml  Output 2500 ml  Net -1540 ml      02/25/2023    4:04 AM 02/24/2023    4:22 AM 02/23/2023    5:15 AM  Last 3 Weights  Weight (lbs) 107 lb 2.3 oz 117 lb 1.6 oz 127 lb 9.6 oz  Weight (kg) 48.6 kg 53.116 kg 57.879 kg      Telemetry    Sinus Rhythm; NSVT x 1 - Personally Reviewed  ECG    No new tracing this morning  Physical Exam   GEN: No acute distress.   Neck: No JVD Cardiac: RRR, 2/6 hsm Respiratory: Clear to auscultation bilaterally. GI: Soft, nontender, non-distended  MS: No edema; No deformity. Neuro:  Nonfocal  Psych: Normal affect    Labs    High Sensitivity Troponin:    Recent Labs  Lab 02/20/23 1700  TROPONINIHS 13     Chemistry Recent Labs  Lab 02/20/23 1700 02/21/23 0555 02/22/23 0600 02/23/23 0045 02/24/23 0106 02/25/23 0053  NA 138 139   < > 138 138 136  K 3.3* 3.6   < > 4.2 4.1 3.1*  CL 104 104   < > 112* 107 102  CO2 24 23   < > 21* 21* 25  GLUCOSE 99 83   < > 118* 115* 115*  BUN 8 7   < > 15 20 18   CREATININE 0.83 0.79   < > 1.17* 1.15* 1.18*  CALCIUM 8.9 8.7*   < > 8.3* 8.8* 8.8*  MG  --  1.7  --   --  2.2 2.3  PROT 7.9 7.4  --   --   --   --   ALBUMIN 2.8* 2.9*  --   --   --   --   AST 26 28  --   --   --   --   ALT 13 13  --   --   --   --   ALKPHOS 140* 124  --   --   --   --  BILITOT 1.7* 2.3*  --   --   --   --   GFRNONAA >60 >60   < > 58* 59* 57*  ANIONGAP 10 12   < > 5 10 9    < > = values in this interval not displayed.    Lipids No results for input(s): "CHOL", "TRIG", "HDL", "LABVLDL", "LDLCALC", "CHOLHDL" in the last 168 hours.  Hematology Recent Labs  Lab 02/20/23 1700 02/22/23 0600 02/22/23 1433 02/22/23 1436 02/22/23 1438  WBC 5.1 4.2  --   --   --   RBC 5.52* 5.18*  --   --   --   HGB 12.0 11.0* 12.6 12.9 13.3  HCT 36.6 34.7* 37.0 38.0 39.0  MCV 66.3* 67.0*  --   --   --   MCH 21.7* 21.2*  --   --   --   MCHC 32.8 31.7  --   --   --   RDW 27.1* 26.5*  --   --   --   PLT 327 300  --   --   --    Thyroid  Recent Labs  Lab 02/21/23 1515  TSH 1.715  FREET4 1.22*    BNP Recent Labs  Lab 02/20/23 1700  BNP 2,176.0*    DDimer No results for input(s): "DDIMER" in the last 168 hours.   Radiology    MR CARDIAC MORPHOLOGY W WO CONTRAST  Result Date: 02/24/2023 CLINICAL DATA:  48F with severe systolic heart failure. Echo with EF 20-25%, moderate RV dysfunction. LHC with normal coronary arteries. EXAM: CARDIAC MRI TECHNIQUE: The patient was scanned on a 1.5 Tesla Siemens magnet. A dedicated cardiac coil was used. Functional imaging was done using Fiesta sequences. 2,3, and 4 chamber views were done  to assess for RWMA's. Modified Simpson's rule using a short axis stack was used to calculate an ejection fraction on a dedicated work [employer]. The patient received 8 cc of Gadavist. After 10 minutes inversion recovery sequences were used to assess for infiltration and scar tissue. Phase contrast velocity mapping was performed above the aortic and pulmonic valves CONTRAST:  8 cc  of Gadavist FINDINGS: Left ventricle: -Moderate dilatation -Moderate systolic dysfunction -Unable to determine ECV as precontrast T1 map not performed -RV insertion site LGE LV EF:  34% (Normal 56-78%) Absolute volumes: LV EDV: (Normal 52-141 mL) LV ESV: (Normal 13-51 mL) LV SV: 61mL (Normal 33-97 mL) CO: 4.3L/min (Normal 2.7-6.0 L/min) Indexed volumes: LV EDV: 163mL/sq-m (Normal 41-81 mL/sq-m) LV ESV: 62mL/sq-m (Normal 12-21 mL/sq-m) LV SV: 65mL/sq-m (Normal 26-56 mL/sq-m) CI: 2.9L/min/sq-m (Normal 1.8-3.8 L/min/sq-m) Right ventricle: Severe dilatation with moderate systolic dysfunction RV EF: 35% (Normal 47-80%) Absolute volumes: RV EDV: (Normal 58-154 mL) RV ESV: (Normal 12-68 mL) RV SV: 75mL (Normal 35-98 mL) CO: 5.3L/min (Normal 2.7-6 L/min) Indexed volumes: RV EDV: 140mL/sq-m (Normal 48-87 mL/sq-m) RV ESV: 69mL/sq-m (Normal 11-28 mL/sq-m) RV SV: 76mL/sq-m (Normal 27-57 mL/sq-m) CI: 3.6L/min/sq-m (Normal 1.8-3.8 L/min/sq-m) Left atrium: Mild enlargement Right atrium: Mild enlargement Mitral valve: Trivial regurgitation Aortic valve: Trivial regurgitation Tricuspid valve: Moderate regurgitation (regurgitant fraction 23%) Pulmonic valve: Trivial regurgitation Aorta: Normal proximal ascending aorta Pericardium: Small effusion IMPRESSION: 1. Moderate LV dilatation with moderate systolic dysfunction (EF 34%) 2.  Severe RV dilatation with moderate systolic dysfunction (EF 35%) 3. RV insertion site LGE, which is a nonspecific scar pattern often seen in setting of elevated pulmonary pressures 4.   Moderate tricuspid regurgitation (regurgitant fraction 23%) 5.  Small  pericardial effusion Electronically Signed   By: Epifanio Lesches M.D.   On: 02/24/2023 19:09   MR CARDIAC VELOCITY FLOW MAP  Result Date: 02/24/2023 CLINICAL DATA:  66F with severe systolic heart failure. Echo with EF 20-25%, moderate RV dysfunction. LHC with normal coronary arteries. EXAM: CARDIAC MRI TECHNIQUE: The patient was scanned on a 1.5 Tesla Siemens magnet. A dedicated cardiac coil was used. Functional imaging was done using Fiesta sequences. 2,3, and 4 chamber views were done to assess for RWMA's. Modified Simpson's rule using a short axis stack was used to calculate an ejection fraction on a dedicated work [employer], [employer]. The patient received 8 cc of Gadavist. After 10 minutes inversion recovery sequences were used to assess for infiltration and scar tissue. Phase contrast velocity mapping was performed above the aortic and pulmonic valves CONTRAST:  8 cc  of Gadavist FINDINGS: Left ventricle: -Moderate dilatation -Moderate systolic dysfunction -Unable to determine ECV as precontrast T1 map not performed -RV insertion site LGE LV EF:  34% (Normal 56-78%) Absolute volumes: LV EDV: (Normal 52-141 mL) LV ESV: (Normal 13-51 mL) LV SV: 61mL (Normal 33-97 mL) CO: 4.3L/min (Normal 2.7-6.0 L/min) Indexed volumes: LV EDV: 197mL/sq-m (Normal 41-81 mL/sq-m) LV ESV: 21mL/sq-m (Normal 12-21 mL/sq-m) LV SV: 15mL/sq-m (Normal 26-56 mL/sq-m) CI: 2.9L/min/sq-m (Normal 1.8-3.8 L/min/sq-m) Right ventricle: Severe dilatation with moderate systolic dysfunction RV EF: 35% (Normal 47-80%) Absolute volumes: RV EDV: (Normal 58-154 mL) RV ESV: (Normal 12-68 mL) RV SV: 75mL (Normal 35-98 mL) CO: 5.3L/min (Normal 2.7-6 L/min) Indexed volumes: RV EDV: 140mL/sq-m (Normal 48-87 mL/sq-m) RV ESV: 20mL/sq-m (Normal 11-28 mL/sq-m) RV SV: 33mL/sq-m (Normal 27-57 mL/sq-m) CI: 3.6L/min/sq-m (Normal 1.8-3.8 L/min/sq-m) Left  atrium: Mild enlargement Right atrium: Mild enlargement Mitral valve: Trivial regurgitation Aortic valve: Trivial regurgitation Tricuspid valve: Moderate regurgitation (regurgitant fraction 23%) Pulmonic valve: Trivial regurgitation Aorta: Normal proximal ascending aorta Pericardium: Small effusion IMPRESSION: 1. Moderate LV dilatation with moderate systolic dysfunction (EF 34%) 2.  Severe RV dilatation with moderate systolic dysfunction (EF 35%) 3. RV insertion site LGE, which is a nonspecific scar pattern often seen in setting of elevated pulmonary pressures 4.  Moderate tricuspid regurgitation (regurgitant fraction 23%) 5.  Small pericardial effusion Electronically Signed   By: Epifanio Lesches M.D.   On: 02/24/2023 19:09   MR CARDIAC VELOCITY FLOW MAP  Result Date: 02/24/2023 CLINICAL DATA:  66F with severe systolic heart failure. Echo with EF 20-25%, moderate RV dysfunction. LHC with normal coronary arteries. EXAM: CARDIAC MRI TECHNIQUE: The patient was scanned on a 1.5 Tesla Siemens magnet. A dedicated cardiac coil was used. Functional imaging was done using Fiesta sequences. 2,3, and 4 chamber views were done to assess for RWMA's. Modified Simpson's rule using a short axis stack was used to calculate an ejection fraction on a dedicated work [employer] officer, [employer]. The patient received 8 cc of Gadavist. After 10 minutes inversion recovery sequences were used to assess for infiltration and scar tissue. Phase contrast velocity mapping was performed above the aortic and pulmonic valves CONTRAST:  8 cc  of Gadavist FINDINGS: Left ventricle: -Moderate dilatation -Moderate systolic dysfunction -Unable to determine ECV as precontrast T1 map not performed -RV insertion site LGE LV EF:  34% (Normal 56-78%) Absolute volumes: LV EDV: (Normal 52-141 mL) LV ESV: (Normal 13-51 mL) LV SV: 61mL (Normal 33-97 mL) CO: 4.3L/min (Normal 2.7-6.0 L/min) Indexed volumes: LV EDV: 157mL/sq-m (Normal 41-81  mL/sq-m) LV ESV: 90mL/sq-m (Normal 12-21 mL/sq-m) LV SV: 78mL/sq-m (  Normal 26-56 mL/sq-m) CI: 2.9L/min/sq-m (Normal 1.8-3.8 L/min/sq-m) Right ventricle: Severe dilatation with moderate systolic dysfunction RV EF: 35% (Normal 47-80%) Absolute volumes: RV EDV: (Normal 58-154 mL) RV ESV: (Normal 12-68 mL) RV SV: 75mL (Normal 35-98 mL) CO: 5.3L/min (Normal 2.7-6 L/min) Indexed volumes: RV EDV: 172mL/sq-m (Normal 48-87 mL/sq-m) RV ESV: 83mL/sq-m (Normal 11-28 mL/sq-m) RV SV: 99mL/sq-m (Normal 27-57 mL/sq-m) CI: 3.6L/min/sq-m (Normal 1.8-3.8 L/min/sq-m) Left atrium: Mild enlargement Right atrium: Mild enlargement Mitral valve: Trivial regurgitation Aortic valve: Trivial regurgitation Tricuspid valve: Moderate regurgitation (regurgitant fraction 23%) Pulmonic valve: Trivial regurgitation Aorta: Normal proximal ascending aorta Pericardium: Small effusion IMPRESSION: 1. Moderate LV dilatation with moderate systolic dysfunction (EF 34%) 2.  Severe RV dilatation with moderate systolic dysfunction (EF 35%) 3. RV insertion site LGE, which is a nonspecific scar pattern often seen in setting of elevated pulmonary pressures 4.  Moderate tricuspid regurgitation (regurgitant fraction 23%) 5.  Small pericardial effusion Electronically Signed   By: Epifanio Lesches M.D.   On: 02/24/2023 19:09    Cardiac Studies   Cath 4/17 Normal coronary anatomy. Elevated RA pressure 19/18, mean 15 mm Hg Mildly elevated pulmonary pressure. 42/17 mean 26 mm Hg Normal LV filling pressures. PCWP 12/14, mean 9 mm Hg. LVEDP 15 mm Hg Normal cardiac output. 5.52 L/min, index 3.56.  TTE 4/17  1. Left ventricular ejection fraction, by estimation, is 20 to 25%. The  left ventricle has severely decreased function. The left ventricle  demonstrates global hypokinesis. The left ventricular internal cavity size  was mildly dilated. Left ventricular  diastolic parameters are consistent with Grade III diastolic dysfunction   (restrictive). Elevated left atrial pressure. There is the  interventricular septum is flattened in diastole ('D' shaped left  ventricle), consistent with right ventricular volume  overload.   2. Right ventricular systolic function is moderately reduced. The right  ventricular size is severely enlarged. There is mildly elevated pulmonary  artery systolic pressure. The estimated right ventricular systolic  pressure is 42.7 mmHg.   3. Left atrial size was moderately dilated.   4. Right atrial size was severely dilated.   5. The mitral valve is normal in structure. Trivial mitral valve  regurgitation.   6. Tricuspid valve regurgitation is moderate to severe.   7. The aortic valve is tricuspid. Aortic valve regurgitation is not  visualized.   8. The inferior vena cava is dilated in size with <50% respiratory  variability, suggesting right atrial pressure of 15 mmHg.   Patient Profile     47 y.o. female a hx of HFrEF in 2023 on GDMT, HTN, uterine fibroids who was seen 02/21/2023 for the evaluation of CHF at the request of Dr. Jacqulyn Bath.   Assessment & Plan    HFrEF BiV HF Severe TR Cath without CAD c/w NICM.  MRI confirms NICM.   Heart Failure Therapy ACE-I/ARB/ARNI: entresto 97-103 mg BID BB: toprol xl 12.5 mg daily   MRA: spiro 25 mg daily SGLT2I: farxiga 10 mg daily Diuretic plan: continue lasix 40 mg IV BID today, likely transition to oral tomorrow. D/w primary service.  HTN Well afterload reduced on current meds, likely no room for bidil.   Ascites s/p paracentesis with 3.4L removed 3 weeks prior; unclear etiology; improved today so likely due to RV dysfunction/overload.  Continue aggressive diuresis.  Uterine fibroids -- follows with OBGYN -- per primary  NSVT: Watch K, Mg; cont Toprol  For questions or updates, please contact [facility] Please consult [email or web address] for contact info under  Signed, Parke Poisson, MD  02/25/2023, 1:30 PM

## 2023-02-25 NOTE — Progress Notes (Signed)
Progress Note   Patient: Mary Rios HQI:[id number] DOB: 03-24-1976 DOA: 02/20/2023     5 DOS: the patient was seen and examined on 02/25/2023   Brief hospital course: Mrs. Rothgeb was admitted with the working diagnosis of heart failure decompensation.   47 yo female with the past medical history of heart failure, and hypertension who presented with worsening abdominal edema. Reported 3 weeks of worsening lower extremity edema, and increased abdominal girth, rapid weight gain, but no dyspnea. She was evaluated by her GYN who recommended her to bee seen in the ED. On her initial physical examination her blood pressure was 169/112, HR 92, RR 19 and 02 saturation 99%, lungs with no wheezing or rales, heart with S1 and S2 present and rhythmic with positive murmurs, abdomen with distention with signs of ascites, positive lower extremity edema +++.   Na 138, K 3,3 CL 104, bicarbonate 24, glucose 99, bun 8 cr 0,83  BNP 2,176  Wbc 5.1 hgb 12.0 plt 327   Urine analysis SG 1,004, negative protein, moderate leukocytes, no bacteria.   Chest radiograph with cardiomegaly with bilateral hilar vascular congestion, bilateral interstitial infiltrates, with small right pleural effusion.  EKG 98 bpm, normal axis, normal intervals, sinus rhythm with poor  R R wave progression, no significant ST segment changes, negative T wave lead III and AvF, positive LVH with large S wave V2.   Patient was placed on IV furosemide for diuresis.  04/16 paracentesis, 3,4 L removed.  Echocardiogram with reduced LV systolic function.  04/17 cardiac catheterization with elevated pulmonary pressure.   04/19 continue diuresing. Cardiac MRI.  04/20 volume status is improving   Assessment and Plan: * Acute on chronic combined systolic and diastolic CHF (congestive heart failure) Echocardiogram with reduced LV systolic function with EF 20 to 25%, global hypokinesis, interventricular septum flattened in diastole, RV with moderate  dilatation, RV cavity with severe dilatation, RVSP 42,7, RA with severe dilatation, moderate to severe TR,   Pulmonary hypertension Acute on chronic core pulmonale.  Cardiac MRI moderate LV dilatation with moderate systolic dysfunction. Severe RV dilatation with moderate systolic dysfunction. Moderate TR.  Small pericardial effusion.  No infiltrative process.   Urine output is 1,900  ml Systolic blood pressure is 106 to 116 mmHg.   Continue medical therapy with dapagliflozin, metoprolol, spironolactone and entresto. Continue diuresis with furosemide 40 mg IV q12   Benign essential HTN Continue blood pressure control with entresto, spironolactone   Ascites Sp paracentesis.  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  Continue to monitor ascites, may need another paracentesis.   AKI (acute kidney injury) Hypokalemia.   Renal function with serum cr at 1,18 with K at 3,1 and serum bicarbonate at 25, Na 136 and Mg 2,3   Volume status has improved, possible transition to po in am.  Add 80 meq Kcl in 2 divided doses. Follow up renal function in am.         Subjective: patient with improvement in dyspnea and abdominal distention. No chest pain or dyspnea.   Physical Exam: Vitals:   02/25/23 0014 02/25/23 0404 02/25/23 0821 02/25/23 1100  BP: 119/73 111/66 118/67 106/71  Pulse: 77 73 65 75  Resp: Temp: 98.2 F (36.8 C) 98.1 F (36.7 C) 98.3 F (36.8 C) 98.1 F (36.7 C)  TempSrc: Oral Oral Oral   SpO2: 99% 100% 100% 100%  Weight:  48.6 kg    Height:       Neurology awake and alert ENT  with mild pallor Cardiovascular with S1 and S2 present and rhythmic with no gallops, rubs or murmurs Mild JVD No lower extremity edema Respiratory with mild rales at bases with no wheezing Abdomen with improvement in distention, no clinical ascites, positive ventral hernia.   Data Reviewed:    Family Communication: her daughter is at the bedside   Disposition: Status is:  Inpatient Remains inpatient appropriate because: heart failure   Planned Discharge Destination: Home     Author: Coralie Keens, MD 02/25/2023 1:28 PM  For on call review [email or web address].

## 2023-02-25 NOTE — Progress Notes (Signed)
PT Cancellation Note  Patient Details Name: Mary Rios MRN: [id number] DOB: 10/22/1976   Cancelled Treatment:    Reason Eval/Treat Not Completed: PT screened, no needs identified, will sign off (patient reports she is independent ambulating in room; friend)in room confirms.    Olivia Canter 02/25/2023, 3:40 PM

## 2023-02-26 DIAGNOSIS — E876 Hypokalemia: Secondary | ICD-10-CM | POA: Diagnosis not present

## 2023-02-26 DIAGNOSIS — I10 Essential (primary) hypertension: Secondary | ICD-10-CM | POA: Diagnosis not present

## 2023-02-26 DIAGNOSIS — I5043 Acute on chronic combined systolic (congestive) and diastolic (congestive) heart failure: Secondary | ICD-10-CM | POA: Diagnosis not present

## 2023-02-26 DIAGNOSIS — N179 Acute kidney failure, unspecified: Secondary | ICD-10-CM | POA: Diagnosis not present

## 2023-02-26 LAB — IRON AND TIBC
Iron: 78 ug/dL (ref 28–170)
Saturation Ratios: 16 % (ref 10.4–31.8)
TIBC: 501 ug/dL — ABNORMAL HIGH (ref 250–450)
UIBC: 423 ug/dL

## 2023-02-26 LAB — BASIC METABOLIC PANEL
Anion gap: 8 (ref 5–15)
BUN: 24 mg/dL — ABNORMAL HIGH (ref 6–20)
CO2: 24 mmol/L (ref 22–32)
Calcium: 9.1 mg/dL (ref 8.9–10.3)
Chloride: 104 mmol/L (ref 98–111)
Creatinine, Ser: 1.28 mg/dL — ABNORMAL HIGH (ref 0.44–1.00)
GFR, Estimated: 52 mL/min — ABNORMAL LOW (ref >60)
Glucose, Bld: 119 mg/dL — ABNORMAL HIGH (ref 70–99)
Potassium: 3.7 mmol/L (ref 3.5–5.1)
Sodium: 136 mmol/L (ref 135–145)

## 2023-02-26 LAB — TRANSFERRIN: Transferrin: 347 mg/dL (ref 192–382)

## 2023-02-26 LAB — FERRITIN: Ferritin: 42 ng/mL (ref 11–307)

## 2023-02-26 MED ORDER — METOPROLOL SUCCINATE ER 25 MG PO TB24: 12.5000 mg | ORAL_TABLET | Freq: Every day | ORAL | 2 refills | Status: DC

## 2023-02-26 MED ORDER — FUROSEMIDE 40 MG PO TABS
40.0000 mg | ORAL_TABLET | Freq: Every day | ORAL | Status: DC
  Administered 2023-02-26: 40 mg via ORAL
  Filled 2023-02-26: qty 1

## 2023-02-26 NOTE — Plan of Care (Signed)
Discharge to home °

## 2023-02-26 NOTE — Discharge Summary (Signed)
Physician Discharge Summary   Patient: Mary Rios MRN: [id number] DOB: 1976/10/28  Admit date:     02/20/2023  Discharge date: 02/26/23  Discharge Physician: Vassie Loll   PCP: Diamantina Providence, FNP   Recommendations at discharge:  Reassess blood pressure/volume status and further adjust antihypertensive regimen and diuretics. Make sure patient has follow-up with cardiology service as instructed Repeat basic metabolic panel to follow ultralights renal function   Discharge Diagnoses: Principal Problem:   Acute on chronic combined systolic and diastolic CHF (congestive heart failure) Active Problems:   Benign essential HTN   Ascites   AKI (acute kidney injury)   Hospital Course: Mary Rios was admitted with the working diagnosis of heart failure decompensation.   47 yo female with the past medical history of heart failure, and hypertension who presented with worsening abdominal edema. Reported 3 weeks of worsening lower extremity edema, and increased abdominal girth, rapid weight gain, but no dyspnea. She was evaluated by her GYN who recommended her to bee seen in the ED. On her initial physical examination her blood pressure was 169/112, HR 92, RR 19 and 02 saturation 99%, lungs with no wheezing or rales, heart with S1 and S2 present and rhythmic with positive murmurs, abdomen with distention with signs of ascites, positive lower extremity edema +++.   Na 138, K 3,3 CL 104, bicarbonate 24, glucose 99, bun 8 cr 0,83  BNP 2,176  Wbc 5.1 hgb 12.0 plt 327   Urine analysis SG 1,004, negative protein, moderate leukocytes, no bacteria.   Chest radiograph with cardiomegaly with bilateral hilar vascular congestion, bilateral interstitial infiltrates, with small right pleural effusion.  EKG 98 bpm, normal axis, normal intervals, sinus rhythm with poor  R R wave progression, no significant ST segment changes, negative T wave lead III and AvF, positive LVH with large S wave V2.    Patient was placed on IV furosemide for diuresis.   Assessment and Plan: * Acute on chronic combined systolic and diastolic CHF (congestive heart failure) -Echocardiogram with reduced LV systolic function with EF 20 to 25%, global hypokinesis, interventricular septum flattened in diastole, RV with moderate dilatation, RV cavity with severe dilatation, RVSP 42,7, RA with severe dilatation, moderate to severe TR -Excellent response to IV diuresis -Discharged home on Entresto, Toprol, spironolactone, Farxiga and Lasix 40 mg twice a day. -Continue close monitoring of patient's renal function and electrolytes -Continue outpatient follow-up with heart failure team. -Education regarding low-sodium diet and daily weights provided to patient. -Patient advised to maintain adequate hydration.  Benign essential HTN -Stable overall -Continue current antihypertensive agent -Patient advised to follow heart healthy/low-sodium diet -Reassess Blood pressure at follow-up visit.    Ascites -status post paracentesis.  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  -Continue to monitor ascites -continue to follow LFT's, sodium level and volume status.  AKI (acute kidney injury) -mild AKI in the setting of CHF exacerbation and diuresis -Cr 1.28 at discharge -diuretics and antihypertensive agents has been adjusted -avoid hypotension and the use of contrast -Follow renal function trend/stability.  Hypokalemia -in the setting of diuresis -electrolyte repleted at discharge -continue to follow trend/stability/    Consultants: Cardiology service Procedures performed: See below for x-ray report Disposition: Home Diet recommendation: Heart healthy/low-sodium diet.  DISCHARGE MEDICATION: Allergies as of 02/26/2023   No Known Allergies      Medication List     STOP taking these medications    hydrALAZINE 25 MG tablet Commonly known as: APRESOLINE  TAKE these medications     busPIRone 5 MG tablet Commonly known as: BUSPAR Take 1 tablet (5 mg total) by mouth 2 (two) times daily. What changed: when to take this   dapagliflozin propanediol 10 MG Tabs tablet Commonly known as: Farxiga Take 1 tablet (10 mg total) by mouth daily before breakfast.   Entresto 97-103 MG Generic drug: sacubitril-valsartan Take 1 tablet by mouth 2 (two) times daily.   furosemide 40 MG tablet Commonly known as: LASIX Take 1 tablet (40 mg total) by mouth twice daily What changed:  how much to take how to take this when to take this additional instructions   Fusion Plus Caps Take 1 capsule by mouth daily.   metoprolol succinate 25 MG 24 hr tablet Commonly known as: TOPROL-XL Take 0.5 tablets (12.5 mg total) by mouth at bedtime.   multivitamin capsule Take 1 capsule by mouth daily.   potassium chloride SA 20 MEQ tablet Commonly known as: KLOR-CON M Take 1 tablet (20 mEq total) by mouth twice daily for 5 days only, then decrease to taking 1 tablet (20 mEq total) by mouth daily thereafter. What changed:  how much to take how to take this when to take this additional instructions   spironolactone 25 MG tablet Commonly known as: ALDACTONE Take 1 tablet (25 mg total) by mouth daily. What changed: how much to take        Follow-up Information     Diamantina Providence, FNP. Schedule an appointment as soon as possible for a visit in 2 week(s).   Specialty: Nurse Practitioner Contact information: [address] [place] [address] [phone]                Discharge Exam: Filed Weights   02/24/23 0422 02/25/23 0404 02/26/23 0420  Weight: 53.1 kg 48.6 kg 46.6 kg   General exam: Alert, awake, oriented x 3; feeling ready to go home; no using accessory muscle.  Good saturation on room air. Respiratory system: Clear to auscultation.  No wheezing or crackles. Cardiovascular system:RRR. No rubs or gallops. Gastrointestinal system: Abdomen  is nondistended, soft and nontender. No organomegaly or masses felt. Normal bowel sounds heard. Central nervous system: Alert and oriented. No focal neurological deficits. Extremities: No cyanosis, clubbing or edema. Skin: No petechiae. Psychiatry: Judgement and insight appear normal. Mood & affect appropriate.    Condition at discharge: Stable and improved.  The results of significant diagnostics from this hospitalization (including imaging, microbiology, ancillary and laboratory) are listed below for reference.   Imaging Studies: MR CARDIAC MORPHOLOGY W WO CONTRAST  Result Date: 02/24/2023 CLINICAL DATA:  3F with severe systolic heart failure. Echo with EF 20-25%, moderate RV dysfunction. LHC with normal coronary arteries. EXAM: CARDIAC MRI TECHNIQUE: The patient was scanned on a 1.5 Tesla Siemens magnet. A dedicated cardiac coil was used. Functional imaging was done using Fiesta sequences. 2,3, and 4 chamber views were done to assess for RWMA's. Modified Simpson's rule using a short axis stack was used to calculate an ejection fraction on a dedicated work [employer], [employer]. The patient received 8 cc of Gadavist. After 10 minutes inversion recovery sequences were used to assess for infiltration and scar tissue. Phase contrast velocity mapping was performed above the aortic and pulmonic valves CONTRAST:  8 cc  of Gadavist FINDINGS: Left ventricle: -Moderate dilatation -Moderate systolic dysfunction -Unable to determine ECV as precontrast T1 map not performed -RV insertion site LGE LV EF:  34% (Normal 56-78%) Absolute  volumes: LV EDV: (Normal 52-141 mL) LV ESV: (Normal 13-51 mL) LV SV: 61mL (Normal 33-97 mL) CO: 4.3L/min (Normal 2.7-6.0 L/min) Indexed volumes: LV EDV: 171mL/sq-m (Normal 41-81 mL/sq-m) LV ESV: 81mL/sq-m (Normal 12-21 mL/sq-m) LV SV: 29mL/sq-m (Normal 26-56 mL/sq-m) CI: 2.9L/min/sq-m (Normal 1.8-3.8 L/min/sq-m) Right ventricle: Severe dilatation with moderate  systolic dysfunction RV EF: 35% (Normal 47-80%) Absolute volumes: RV EDV: (Normal 58-154 mL) RV ESV: (Normal 12-68 mL) RV SV: 75mL (Normal 35-98 mL) CO: 5.3L/min (Normal 2.7-6 L/min) Indexed volumes: RV EDV: 162mL/sq-m (Normal 48-87 mL/sq-m) RV ESV: 88mL/sq-m (Normal 11-28 mL/sq-m) RV SV: 47mL/sq-m (Normal 27-57 mL/sq-m) CI: 3.6L/min/sq-m (Normal 1.8-3.8 L/min/sq-m) Left atrium: Mild enlargement Right atrium: Mild enlargement Mitral valve: Trivial regurgitation Aortic valve: Trivial regurgitation Tricuspid valve: Moderate regurgitation (regurgitant fraction 23%) Pulmonic valve: Trivial regurgitation Aorta: Normal proximal ascending aorta Pericardium: Small effusion IMPRESSION: 1. Moderate LV dilatation with moderate systolic dysfunction (EF 34%) 2.  Severe RV dilatation with moderate systolic dysfunction (EF 35%) 3. RV insertion site LGE, which is a nonspecific scar pattern often seen in setting of elevated pulmonary pressures 4.  Moderate tricuspid regurgitation (regurgitant fraction 23%) 5.  Small pericardial effusion Electronically Signed   By: Epifanio Lesches M.D.   On: 02/24/2023 19:09   MR CARDIAC VELOCITY FLOW MAP  Result Date: 02/24/2023 CLINICAL DATA:  18F with severe systolic heart failure. Echo with EF 20-25%, moderate RV dysfunction. LHC with normal coronary arteries. EXAM: CARDIAC MRI TECHNIQUE: The patient was scanned on a 1.5 Tesla Siemens magnet. A dedicated cardiac coil was used. Functional imaging was done using Fiesta sequences. 2,3, and 4 chamber views were done to assess for RWMA's. Modified Simpson's rule using a short axis stack was used to calculate an ejection fraction on a dedicated work [employer], [employer]. The patient received 8 cc of Gadavist. After 10 minutes inversion recovery sequences were used to assess for infiltration and scar tissue. Phase contrast velocity mapping was performed above the aortic and pulmonic valves CONTRAST:  8 cc  of Gadavist  FINDINGS: Left ventricle: -Moderate dilatation -Moderate systolic dysfunction -Unable to determine ECV as precontrast T1 map not performed -RV insertion site LGE LV EF:  34% (Normal 56-78%) Absolute volumes: LV EDV: (Normal 52-141 mL) LV ESV: (Normal 13-51 mL) LV SV: 61mL (Normal 33-97 mL) CO: 4.3L/min (Normal 2.7-6.0 L/min) Indexed volumes: LV EDV: 148mL/sq-m (Normal 41-81 mL/sq-m) LV ESV: 51mL/sq-m (Normal 12-21 mL/sq-m) LV SV: 67mL/sq-m (Normal 26-56 mL/sq-m) CI: 2.9L/min/sq-m (Normal 1.8-3.8 L/min/sq-m) Right ventricle: Severe dilatation with moderate systolic dysfunction RV EF: 35% (Normal 47-80%) Absolute volumes: RV EDV: (Normal 58-154 mL) RV ESV: (Normal 12-68 mL) RV SV: 75mL (Normal 35-98 mL) CO: 5.3L/min (Normal 2.7-6 L/min) Indexed volumes: RV EDV: 123mL/sq-m (Normal 48-87 mL/sq-m) RV ESV: 56mL/sq-m (Normal 11-28 mL/sq-m) RV SV: 23mL/sq-m (Normal 27-57 mL/sq-m) CI: 3.6L/min/sq-m (Normal 1.8-3.8 L/min/sq-m) Left atrium: Mild enlargement Right atrium: Mild enlargement Mitral valve: Trivial regurgitation Aortic valve: Trivial regurgitation Tricuspid valve: Moderate regurgitation (regurgitant fraction 23%) Pulmonic valve: Trivial regurgitation Aorta: Normal proximal ascending aorta Pericardium: Small effusion IMPRESSION: 1. Moderate LV dilatation with moderate systolic dysfunction (EF 34%) 2.  Severe RV dilatation with moderate systolic dysfunction (EF 35%) 3. RV insertion site LGE, which is a nonspecific scar pattern often seen in setting of elevated pulmonary pressures 4.  Moderate tricuspid regurgitation (regurgitant fraction 23%) 5.  Small pericardial effusion Electronically Signed   By: Epifanio Lesches M.D.   On: 02/24/2023 19:09   MR CARDIAC VELOCITY  FLOW MAP  Result Date: 02/24/2023 CLINICAL DATA:  70F with severe systolic heart failure. Echo with EF 20-25%, moderate RV dysfunction. LHC with normal coronary arteries. EXAM: CARDIAC MRI TECHNIQUE: The patient was scanned  on a 1.5 Tesla Siemens magnet. A dedicated cardiac coil was used. Functional imaging was done using Fiesta sequences. 2,3, and 4 chamber views were done to assess for RWMA's. Modified Simpson's rule using a short axis stack was used to calculate an ejection fraction on a dedicated work [employer] officer, [employer]. The patient received 8 cc of Gadavist. After 10 minutes inversion recovery sequences were used to assess for infiltration and scar tissue. Phase contrast velocity mapping was performed above the aortic and pulmonic valves CONTRAST:  8 cc  of Gadavist FINDINGS: Left ventricle: -Moderate dilatation -Moderate systolic dysfunction -Unable to determine ECV as precontrast T1 map not performed -RV insertion site LGE LV EF:  34% (Normal 56-78%) Absolute volumes: LV EDV: (Normal 52-141 mL) LV ESV: (Normal 13-51 mL) LV SV: 61mL (Normal 33-97 mL) CO: 4.3L/min (Normal 2.7-6.0 L/min) Indexed volumes: LV EDV: 133mL/sq-m (Normal 41-81 mL/sq-m) LV ESV: 65mL/sq-m (Normal 12-21 mL/sq-m) LV SV: 29mL/sq-m (Normal 26-56 mL/sq-m) CI: 2.9L/min/sq-m (Normal 1.8-3.8 L/min/sq-m) Right ventricle: Severe dilatation with moderate systolic dysfunction RV EF: 35% (Normal 47-80%) Absolute volumes: RV EDV: (Normal 58-154 mL) RV ESV: (Normal 12-68 mL) RV SV: 75mL (Normal 35-98 mL) CO: 5.3L/min (Normal 2.7-6 L/min) Indexed volumes: RV EDV: 138mL/sq-m (Normal 48-87 mL/sq-m) RV ESV: 67mL/sq-m (Normal 11-28 mL/sq-m) RV SV: 14mL/sq-m (Normal 27-57 mL/sq-m) CI: 3.6L/min/sq-m (Normal 1.8-3.8 L/min/sq-m) Left atrium: Mild enlargement Right atrium: Mild enlargement Mitral valve: Trivial regurgitation Aortic valve: Trivial regurgitation Tricuspid valve: Moderate regurgitation (regurgitant fraction 23%) Pulmonic valve: Trivial regurgitation Aorta: Normal proximal ascending aorta Pericardium: Small effusion IMPRESSION: 1. Moderate LV dilatation with moderate systolic dysfunction (EF 34%) 2.  Severe RV dilatation with moderate  systolic dysfunction (EF 35%) 3. RV insertion site LGE, which is a nonspecific scar pattern often seen in setting of elevated pulmonary pressures 4.  Moderate tricuspid regurgitation (regurgitant fraction 23%) 5.  Small pericardial effusion Electronically Signed   By: Epifanio Lesches M.D.   On: 02/24/2023 19:09   CARDIAC CATHETERIZATION  Result Date: 02/22/2023   LV end diastolic pressure is normal. Normal coronary anatomy. Elevated RA pressure 19/18, mean 15 mm Hg Mildly elevated pulmonary pressure. 42/17 mean 26 mm Hg Normal LV filling pressures. PCWP 12/14, mean 9 mm Hg. LVEDP 15 mm Hg Normal cardiac output. 5.52 L/min, index 3.56. Plan: medical therapy   ECHOCARDIOGRAM COMPLETE  Result Date: 02/22/2023    ECHOCARDIOGRAM REPORT   Patient Name:   JENNA ROUTZAHN Date of Exam: 02/22/2023 Medical Rec #:  [id number]       Height:       59.0 in Accession #:    [id number]      Weight:       133.4 lb Date of Birth:  08-Mar-1976        BSA:          1.553 m Patient Age:    47 years        BP:           124/78 mmHg Patient Gender: F               HR:           68 bpm. Exam Location:  Inpatient Procedure: 2D Echo, Cardiac Doppler, Color Doppler and Intracardiac  Opacification Agent Indications:    CHF-Acute Systolic I50.21  History:        Patient has prior history of Echocardiogram examinations, most                 recent 08/11/2022. CHF; Risk Factors:Hypertension.  Sonographer:    Lucendia Herrlich Referring Phys: [id number] ERIC CHEN IMPRESSIONS  1. Left ventricular ejection fraction, by estimation, is 20 to 25%. The left ventricle has severely decreased function. The left ventricle demonstrates global hypokinesis. The left ventricular internal cavity size was mildly dilated. Left ventricular diastolic parameters are consistent with Grade III diastolic dysfunction (restrictive). Elevated left atrial pressure. There is the interventricular septum is flattened in diastole ('D' shaped left ventricle),  consistent with right ventricular volume overload.  2. Right ventricular systolic function is moderately reduced. The right ventricular size is severely enlarged. There is mildly elevated pulmonary artery systolic pressure. The estimated right ventricular systolic pressure is 42.7 mmHg.  3. Left atrial size was moderately dilated.  4. Right atrial size was severely dilated.  5. The mitral valve is normal in structure. Trivial mitral valve regurgitation.  6. Tricuspid valve regurgitation is moderate to severe.  7. The aortic valve is tricuspid. Aortic valve regurgitation is not visualized.  8. The inferior vena cava is dilated in size with <50% respiratory variability, suggesting right atrial pressure of 15 mmHg. Comparison(s): Prior images reviewed side by side. The left ventricular systolic function is unchanged. The left ventricular diastolic function is significantly worse. There is evidence of increased right and left heart filling pressures compared to the previous study. FINDINGS  Left Ventricle: Left ventricular ejection fraction, by estimation, is 20 to 25%. The left ventricle has severely decreased function. The left ventricle demonstrates global hypokinesis. Definity contrast agent was given IV to delineate the left ventricular endocardial borders. The left ventricular internal cavity size was mildly dilated. There is no left ventricular hypertrophy. The interventricular septum is flattened in diastole ('D' shaped left ventricle), consistent with right ventricular volume overload. Left ventricular diastolic parameters are consistent with Grade III diastolic dysfunction (restrictive). Elevated left atrial pressure. Right Ventricle: The right ventricular size is severely enlarged. No increase in right ventricular wall thickness. Right ventricular systolic function is moderately reduced. There is mildly elevated pulmonary artery systolic pressure. The tricuspid regurgitant velocity is 2.63 m/s, and with an  assumed right atrial pressure of 15 mmHg, the estimated right ventricular systolic pressure is 42.7 mmHg. Left Atrium: Left atrial size was moderately dilated. Right Atrium: Right atrial size was severely dilated. Pericardium: Trivial pericardial effusion is present. Mitral Valve: The mitral valve is normal in structure. Trivial mitral valve regurgitation. Tricuspid Valve: The tricuspid valve is normal in structure. Tricuspid valve regurgitation is moderate to severe. Aortic Valve: The aortic valve is tricuspid. Aortic valve regurgitation is not visualized. Aortic valve peak gradient measures 8.0 mmHg. Pulmonic Valve: The pulmonic valve was normal in structure. Pulmonic valve regurgitation is trivial. Aorta: The aortic root and ascending aorta are structurally normal, with no evidence of dilitation. Venous: The inferior vena cava is dilated in size with less than 50% respiratory variability, suggesting right atrial pressure of 15 mmHg. IAS/Shunts: No atrial level shunt detected by color flow Doppler.  LEFT VENTRICLE PLAX 2D LVIDd:         5.63 cm      Diastology LVIDs:         4.96 cm      LV e' medial:    4.38 cm/s LV PW:  0.80 cm      LV E/e' medial:  26.3 LV IVS:        1.00 cm      LV e' lateral:   11.20 cm/s LVOT diam:     2.00 cm      LV E/e' lateral: 10.3 LV SV:         63 LV SV Index:   40 LVOT Area:     3.14 cm  LV Volumes (MOD) LV vol d, MOD A2C: 157.0 ml LV vol d, MOD A4C: 131.0 ml LV vol s, MOD A2C: 115.0 ml LV vol s, MOD A4C: 86.9 ml LV SV MOD A2C:     42.0 ml LV SV MOD A4C:     131.0 ml LV SV MOD BP:      45.9 ml RIGHT VENTRICLE             IVC RV S prime:     10.70 cm/s  IVC diam: 2.00 cm TAPSE (M-mode): 1.3 cm LEFT ATRIUM             Index        RIGHT ATRIUM           Index LA diam:        4.70 cm 3.03 cm/m   RA Area:     23.70 cm LA Vol (A2C):   65.9 ml 42.44 ml/m  RA Volume:   85.80 ml  55.26 ml/m LA Vol (A4C):   50.4 ml 32.46 ml/m LA Biplane Vol: 58.7 ml 37.80 ml/m  AORTIC VALVE                  PULMONIC VALVE AV Area (Vmax): 2.66 cm     PR End Diast Vel: 4.67 msec AV Vmax:        141.00 cm/s AV Peak Grad:   8.0 mmHg LVOT Vmax:      119.50 cm/s LVOT Vmean:     78.650 cm/s LVOT VTI:       0.200 m  AORTA Ao Root diam: 2.60 cm Ao Asc diam:  3.00 cm MITRAL VALVE                TRICUSPID VALVE MV Area (PHT): 5.27 cm     TR Peak grad:   27.7 mmHg MV Decel Time: 144 msec     TR Vmax:        263.00 cm/s MV E velocity: 115.00 cm/s MV A velocity: 52.40 cm/s   SHUNTS MV E/A ratio:  2.19         Systemic VTI:  0.20 m                             Systemic Diam: 2.00 cm Rachelle Hora Croitoru MD Electronically signed by Thurmon Fair MD Signature Date/Time: 02/22/2023/11:10:58 AM    Final    IR Paracentesis  Result Date: 02/21/2023 INDICATION: History of chronic systolic heart failure with EF of 20-25% and HTN with complaint of shortness of breath and lower extremity edema. Patient found to have masses ascites on ultrasound exam. Request received for diagnostic and therapeutic paracentesis. EXAM: ULTRASOUND GUIDED DIAGNOSTIC AND THERAPEUTIC LEFT LOWER QUADRANT PARACENTESIS MEDICATIONS: 10 mL 1 % lidocaine COMPLICATIONS: None immediate. PROCEDURE: Informed written consent was obtained from the patient after a discussion of the risks, benefits and alternatives to treatment. A timeout was performed prior to the initiation of the procedure. Initial ultrasound scanning demonstrates a large  amount of ascites within the left lower abdominal quadrant. The left lower abdomen was prepped and draped in the usual sterile fashion. 1% lidocaine was used for local anesthesia. Following this, a 19 gauge, 7-cm, Yueh catheter was introduced. An ultrasound image was saved for documentation purposes. The paracentesis was performed. The catheter was removed and a dressing was applied. The patient tolerated the procedure well without immediate post procedural complication. FINDINGS: A total of approximately 3.4 L of cloudy, golden  colored fluid was removed. Samples were sent to the laboratory as requested by the clinical team. IMPRESSION: Successful ultrasound-guided paracentesis yielding 3.4 liters of peritoneal fluid. Read by: [name], AGNP-BC Electronically Signed   By: Acquanetta Belling M.D.   On: 02/21/2023 09:29   DG Chest 2 View  Result Date: 02/20/2023 CLINICAL DATA:  Shortness of breath x3 weeks. EXAM: CHEST - 2 VIEW COMPARISON:  February 17, 2016 FINDINGS: There is mild to moderate severity enlargement of the cardiac silhouette. Mild diffusely increased interstitial lung markings are seen. Mild linear atelectasis is noted within the lateral aspect of the right lung base. There is a small right pleural effusion. No pneumothorax is identified. The visualized skeletal structures are unremarkable. IMPRESSION: 1. Cardiomegaly with mild interstitial edema. 2. Mild right basilar linear atelectasis. 3. Small right pleural effusion. Electronically Signed   By: Aram Candela M.D.   On: 02/20/2023 17:24    Microbiology: Results for orders placed or performed during the hospital encounter of 02/20/23  Culture, body fluid w Gram Stain-bottle     Status: None   Collection Time: 02/21/23  9:35 AM   Specimen: Paracentesis  Result Value Ref Range Status   Specimen Description PARACENTESIS  Final   Special Requests NONE  Final   Culture   Final    NO GROWTH 5 DAYS Performed at [facility], [address] [place], [place] [address]    Report Status 02/26/2023 FINAL  Final  Gram stain     Status: None   Collection Time: 02/21/23  9:35 AM   Specimen: Paracentesis  Result Value Ref Range Status   Specimen Description PARACENTESIS  Final   Special Requests NONE  Final   Gram Stain   Final    NO WBC SEEN NO ORGANISMS SEEN Performed at [facility], [address] [place], [place] [address]    Report Status 02/21/2023 FINAL  Final    Labs: CBC: Recent Labs  Lab 02/20/23 1700 02/22/23 0600 02/22/23 1433  02/22/23 1436 02/22/23 1438  WBC 5.1 4.2  --   --   --   NEUTROABS 3.8  --   --   --   --   HGB 12.0 11.0* 12.6 12.9 13.3  HCT 36.6 34.7* 37.0 38.0 39.0  MCV 66.3* 67.0*  --   --   --   PLT 327 300  --   --   --    Basic Metabolic Panel: Recent Labs  Lab 02/21/23 0555 02/22/23 0600 02/22/23 1433 02/22/23 1438 02/23/23 0045 02/24/23 0106 02/25/23 0053 02/26/23 0053  NA 139 137   < > 141 138 138 136 136  K 3.6 3.5   < > 3.8 4.2 4.1 3.1* 3.7  CL 104 105  --   --  112* 107 102 104  CO2 23 25  --   --  21* 21* 25 24  GLUCOSE 83 99  --   --  118* 115* 115* 119*  BUN 7 11  --   --  15 20 18  24*  CREATININE 0.79 1.01*  --   --  1.17* 1.15* 1.18* 1.28*  CALCIUM 8.7* 8.6*  --   --  8.3* 8.8* 8.8* 9.1  MG 1.7  --   --   --   --  2.2 2.3  --    < > = values in this interval not displayed.   Liver Function Tests: Recent Labs  Lab 02/20/23 1700 02/21/23 0555  AST 26 28  ALT 13 13  ALKPHOS 140* 124  BILITOT 1.7* 2.3*  PROT 7.9 7.4  ALBUMIN 2.8* 2.9*   CBG: No results for input(s): "GLUCAP" in the last 168 hours.  Discharge time spent: greater than 30 minutes.  Signed: Vassie Loll, MD [facility] 02/26/2023

## 2023-02-26 NOTE — Plan of Care (Signed)
  Problem: Clinical Measurements: Goal: Will remain free from infection Outcome: Completed/Met   Problem: Nutrition: Goal: Adequate nutrition will be maintained Outcome: Completed/Met   Problem: Pain Managment: Goal: General experience of comfort will improve Outcome: Completed/Met   

## 2023-02-26 NOTE — Progress Notes (Signed)
Rounding Note    Patient Name: Mary Rios Date of Encounter: 02/26/2023  [facility] Cardiologist: Meriam Sprague, MD   Subjective   No acute events overnight.  Brisk diuresis, net negative -10L and Cr mildly elevated today. Almost 4 L of UOP yesterday.   Inpatient Medications    Scheduled Meds:  dapagliflozin propanediol  10 mg Oral Daily   enoxaparin (LOVENOX) injection  40 mg Subcutaneous Q24H   metoprolol succinate  12.5 mg Oral QHS   sacubitril-valsartan  1 tablet Oral BID   sodium chloride flush  3 mL Intravenous Q12H   sodium chloride flush  3 mL Intravenous Q12H   spironolactone  25 mg Oral QHS   Continuous Infusions:  sodium chloride     sodium chloride     PRN Meds: sodium chloride, sodium chloride, acetaminophen, gadobutrol, ondansetron (ZOFRAN) IV, mouth rinse, sodium chloride flush, sodium chloride flush   Vital Signs    Vitals:   02/25/23 2013 02/26/23 0010 02/26/23 0420 02/26/23 0840  BP: 111/61 (!) 104/58 108/65 116/72  Pulse: 84  70 75  Resp: 17 16  18   Temp: 98.6 F (37 C) 98.2 F (36.8 C) 98.1 F (36.7 C) 97.8 F (36.6 C)  TempSrc: Oral Oral Oral   SpO2: 100% 99% 100% 100%  Weight:   46.6 kg   Height:        Intake/Output Summary (Last 24 hours) at 02/26/2023 0850 Last data filed at 02/26/2023 0841 Gross per 24 hour  Intake 1591 ml  Output 3700 ml  Net -2109 ml      02/26/2023    4:20 AM 02/25/2023    4:04 AM 02/24/2023    4:22 AM  Last 3 Weights  Weight (lbs) 102 lb 12.8 oz 107 lb 2.3 oz 117 lb 1.6 oz  Weight (kg) 46.63 kg 48.6 kg 53.116 kg      Telemetry    Sinus Rhythm; NSVT x 1 - Personally Reviewed  ECG    No new tracing this morning  Physical Exam   GEN: No acute distress.   Neck: No JVD Cardiac: RRR, 2/6 hsm Respiratory: Clear to auscultation bilaterally. GI: Soft, nontender, non-distended  MS: No edema; No deformity. Neuro:  Nonfocal  Psych: Normal affect    Labs    High Sensitivity  Troponin:   Recent Labs  Lab 02/20/23 1700  TROPONINIHS 13     Chemistry Recent Labs  Lab 02/20/23 1700 02/21/23 0555 02/22/23 0600 02/24/23 0106 02/25/23 0053 02/26/23 0053  NA 138 139   < > 138 136 136  K 3.3* 3.6   < > 4.1 3.1* 3.7  CL 104 104   < > 107 102 104  CO2 24 23   < > 21* 25 24  GLUCOSE 99 83   < > 115* 115* 119*  BUN 8 7   < > 20 18 24*  CREATININE 0.83 0.79   < > 1.15* 1.18* 1.28*  CALCIUM 8.9 8.7*   < > 8.8* 8.8* 9.1  MG  --  1.7  --  2.2 2.3  --   PROT 7.9 7.4  --   --   --   --   ALBUMIN 2.8* 2.9*  --   --   --   --   AST 26 28  --   --   --   --   ALT 13 13  --   --   --   --   [facility]  140* 124  --   --   --   --   BILITOT 1.7* 2.3*  --   --   --   --   GFRNONAA >60 >60   < > 59* 57* 52*  ANIONGAP 10 12   < > < > = values in this interval not displayed.    Lipids No results for input(s): "CHOL", "TRIG", "HDL", "LABVLDL", "LDLCALC", "CHOLHDL" in the last 168 hours.  Hematology Recent Labs  Lab 02/20/23 1700 02/22/23 0600 02/22/23 1433 02/22/23 1436 02/22/23 1438  WBC 5.1 4.2  --   --   --   RBC 5.52* 5.18*  --   --   --   HGB 12.0 11.0* 12.6 12.9 13.3  HCT 36.6 34.7* 37.0 38.0 39.0  MCV 66.3* 67.0*  --   --   --   MCH 21.7* 21.2*  --   --   --   MCHC 32.8 31.7  --   --   --   RDW 27.1* 26.5*  --   --   --   PLT 327 300  --   --   --    Thyroid  Recent Labs  Lab 02/21/23 1515  TSH 1.715  FREET4 1.22*    BNP Recent Labs  Lab 02/20/23 1700  BNP 2,176.0*    DDimer No results for input(s): "DDIMER" in the last 168 hours.   Radiology    MR CARDIAC MORPHOLOGY W WO CONTRAST  Result Date: 02/24/2023 CLINICAL DATA:  15F with severe systolic heart failure. Echo with EF 20-25%, moderate RV dysfunction. LHC with normal coronary arteries. EXAM: CARDIAC MRI TECHNIQUE: The patient was scanned on a 1.5 Tesla Siemens magnet. A dedicated cardiac coil was used. Functional imaging was done using Fiesta sequences. 2,3, and 4 chamber views  were done to assess for RWMA's. Modified Simpson's rule using a short axis stack was used to calculate an ejection fraction on a dedicated work [employer]. The patient received 8 cc of Gadavist. After 10 minutes inversion recovery sequences were used to assess for infiltration and scar tissue. Phase contrast velocity mapping was performed above the aortic and pulmonic valves CONTRAST:  8 cc  of Gadavist FINDINGS: Left ventricle: -Moderate dilatation -Moderate systolic dysfunction -Unable to determine ECV as precontrast T1 map not performed -RV insertion site LGE LV EF:  34% (Normal 56-78%) Absolute volumes: LV EDV: (Normal 52-141 mL) LV ESV: (Normal 13-51 mL) LV SV: 61mL (Normal 33-97 mL) CO: 4.3L/min (Normal 2.7-6.0 L/min) Indexed volumes: LV EDV: 160mL/sq-m (Normal 41-81 mL/sq-m) LV ESV: 45mL/sq-m (Normal 12-21 mL/sq-m) LV SV: 49mL/sq-m (Normal 26-56 mL/sq-m) CI: 2.9L/min/sq-m (Normal 1.8-3.8 L/min/sq-m) Right ventricle: Severe dilatation with moderate systolic dysfunction RV EF: 35% (Normal 47-80%) Absolute volumes: RV EDV: (Normal 58-154 mL) RV ESV: (Normal 12-68 mL) RV SV: 75mL (Normal 35-98 mL) CO: 5.3L/min (Normal 2.7-6 L/min) Indexed volumes: RV EDV: 160mL/sq-m (Normal 48-87 mL/sq-m) RV ESV: 35mL/sq-m (Normal 11-28 mL/sq-m) RV SV: 73mL/sq-m (Normal 27-57 mL/sq-m) CI: 3.6L/min/sq-m (Normal 1.8-3.8 L/min/sq-m) Left atrium: Mild enlargement Right atrium: Mild enlargement Mitral valve: Trivial regurgitation Aortic valve: Trivial regurgitation Tricuspid valve: Moderate regurgitation (regurgitant fraction 23%) Pulmonic valve: Trivial regurgitation Aorta: Normal proximal ascending aorta Pericardium: Small effusion IMPRESSION: 1. Moderate LV dilatation with moderate systolic dysfunction (EF 34%) 2.  Severe RV dilatation with moderate systolic dysfunction (EF 35%) 3. RV insertion site LGE, which is a nonspecific scar pattern often seen in setting of  elevated pulmonary  pressures 4.  Moderate tricuspid regurgitation (regurgitant fraction 23%) 5.  Small pericardial effusion Electronically Signed   By: Epifanio Lesches M.D.   On: 02/24/2023 19:09   MR CARDIAC VELOCITY FLOW MAP  Result Date: 02/24/2023 CLINICAL DATA:  63F with severe systolic heart failure. Echo with EF 20-25%, moderate RV dysfunction. LHC with normal coronary arteries. EXAM: CARDIAC MRI TECHNIQUE: The patient was scanned on a 1.5 Tesla Siemens magnet. A dedicated cardiac coil was used. Functional imaging was done using Fiesta sequences. 2,3, and 4 chamber views were done to assess for RWMA's. Modified Simpson's rule using a short axis stack was used to calculate an ejection fraction on a dedicated work [employer], [employer]. The patient received 8 cc of Gadavist. After 10 minutes inversion recovery sequences were used to assess for infiltration and scar tissue. Phase contrast velocity mapping was performed above the aortic and pulmonic valves CONTRAST:  8 cc  of Gadavist FINDINGS: Left ventricle: -Moderate dilatation -Moderate systolic dysfunction -Unable to determine ECV as precontrast T1 map not performed -RV insertion site LGE LV EF:  34% (Normal 56-78%) Absolute volumes: LV EDV: (Normal 52-141 mL) LV ESV: (Normal 13-51 mL) LV SV: 61mL (Normal 33-97 mL) CO: 4.3L/min (Normal 2.7-6.0 L/min) Indexed volumes: LV EDV: 191mL/sq-m (Normal 41-81 mL/sq-m) LV ESV: 41mL/sq-m (Normal 12-21 mL/sq-m) LV SV: 36mL/sq-m (Normal 26-56 mL/sq-m) CI: 2.9L/min/sq-m (Normal 1.8-3.8 L/min/sq-m) Right ventricle: Severe dilatation with moderate systolic dysfunction RV EF: 35% (Normal 47-80%) Absolute volumes: RV EDV: (Normal 58-154 mL) RV ESV: (Normal 12-68 mL) RV SV: 75mL (Normal 35-98 mL) CO: 5.3L/min (Normal 2.7-6 L/min) Indexed volumes: RV EDV: 171mL/sq-m (Normal 48-87 mL/sq-m) RV ESV: 12mL/sq-m (Normal 11-28 mL/sq-m) RV SV: 65mL/sq-m (Normal 27-57 mL/sq-m) CI: 3.6L/min/sq-m (Normal 1.8-3.8  L/min/sq-m) Left atrium: Mild enlargement Right atrium: Mild enlargement Mitral valve: Trivial regurgitation Aortic valve: Trivial regurgitation Tricuspid valve: Moderate regurgitation (regurgitant fraction 23%) Pulmonic valve: Trivial regurgitation Aorta: Normal proximal ascending aorta Pericardium: Small effusion IMPRESSION: 1. Moderate LV dilatation with moderate systolic dysfunction (EF 34%) 2.  Severe RV dilatation with moderate systolic dysfunction (EF 35%) 3. RV insertion site LGE, which is a nonspecific scar pattern often seen in setting of elevated pulmonary pressures 4.  Moderate tricuspid regurgitation (regurgitant fraction 23%) 5.  Small pericardial effusion Electronically Signed   By: Epifanio Lesches M.D.   On: 02/24/2023 19:09   MR CARDIAC VELOCITY FLOW MAP  Result Date: 02/24/2023 CLINICAL DATA:  63F with severe systolic heart failure. Echo with EF 20-25%, moderate RV dysfunction. LHC with normal coronary arteries. EXAM: CARDIAC MRI TECHNIQUE: The patient was scanned on a 1.5 Tesla Siemens magnet. A dedicated cardiac coil was used. Functional imaging was done using Fiesta sequences. 2,3, and 4 chamber views were done to assess for RWMA's. Modified Simpson's rule using a short axis stack was used to calculate an ejection fraction on a dedicated work [employer] officer, [employer]. The patient received 8 cc of Gadavist. After 10 minutes inversion recovery sequences were used to assess for infiltration and scar tissue. Phase contrast velocity mapping was performed above the aortic and pulmonic valves CONTRAST:  8 cc  of Gadavist FINDINGS: Left ventricle: -Moderate dilatation -Moderate systolic dysfunction -Unable to determine ECV as precontrast T1 map not performed -RV insertion site LGE LV EF:  34% (Normal 56-78%) Absolute volumes: LV EDV: (Normal 52-141 mL) LV ESV: (Normal 13-51 mL) LV SV: 61mL (Normal 33-97 mL) CO: 4.3L/min (Normal 2.7-6.0 L/min) Indexed volumes: LV  EDV:  170mL/sq-m (Normal 41-81 mL/sq-m) LV ESV: 47mL/sq-m (Normal 12-21 mL/sq-m) LV SV: 56mL/sq-m (Normal 26-56 mL/sq-m) CI: 2.9L/min/sq-m (Normal 1.8-3.8 L/min/sq-m) Right ventricle: Severe dilatation with moderate systolic dysfunction RV EF: 35% (Normal 47-80%) Absolute volumes: RV EDV: (Normal 58-154 mL) RV ESV: (Normal 12-68 mL) RV SV: 75mL (Normal 35-98 mL) CO: 5.3L/min (Normal 2.7-6 L/min) Indexed volumes: RV EDV: 119mL/sq-m (Normal 48-87 mL/sq-m) RV ESV: 70mL/sq-m (Normal 11-28 mL/sq-m) RV SV: 67mL/sq-m (Normal 27-57 mL/sq-m) CI: 3.6L/min/sq-m (Normal 1.8-3.8 L/min/sq-m) Left atrium: Mild enlargement Right atrium: Mild enlargement Mitral valve: Trivial regurgitation Aortic valve: Trivial regurgitation Tricuspid valve: Moderate regurgitation (regurgitant fraction 23%) Pulmonic valve: Trivial regurgitation Aorta: Normal proximal ascending aorta Pericardium: Small effusion IMPRESSION: 1. Moderate LV dilatation with moderate systolic dysfunction (EF 34%) 2.  Severe RV dilatation with moderate systolic dysfunction (EF 35%) 3. RV insertion site LGE, which is a nonspecific scar pattern often seen in setting of elevated pulmonary pressures 4.  Moderate tricuspid regurgitation (regurgitant fraction 23%) 5.  Small pericardial effusion Electronically Signed   By: Epifanio Lesches M.D.   On: 02/24/2023 19:09    Cardiac Studies   Cath 4/17 Normal coronary anatomy. Elevated RA pressure 19/18, mean 15 mm Hg Mildly elevated pulmonary pressure. 42/17 mean 26 mm Hg Normal LV filling pressures. PCWP 12/14, mean 9 mm Hg. LVEDP 15 mm Hg Normal cardiac output. 5.52 L/min, index 3.56.  TTE 4/17  1. Left ventricular ejection fraction, by estimation, is 20 to 25%. The  left ventricle has severely decreased function. The left ventricle  demonstrates global hypokinesis. The left ventricular internal cavity size  was mildly dilated. Left ventricular  diastolic parameters are consistent with Grade III diastolic  dysfunction  (restrictive). Elevated left atrial pressure. There is the  interventricular septum is flattened in diastole ('D' shaped left  ventricle), consistent with right ventricular volume  overload.   2. Right ventricular systolic function is moderately reduced. The right  ventricular size is severely enlarged. There is mildly elevated pulmonary  artery systolic pressure. The estimated right ventricular systolic  pressure is 42.7 mmHg.   3. Left atrial size was moderately dilated.   4. Right atrial size was severely dilated.   5. The mitral valve is normal in structure. Trivial mitral valve  regurgitation.   6. Tricuspid valve regurgitation is moderate to severe.   7. The aortic valve is tricuspid. Aortic valve regurgitation is not  visualized.   8. The inferior vena cava is dilated in size with <50% respiratory  variability, suggesting right atrial pressure of 15 mmHg.   Patient Profile     47 y.o. female a hx of HFrEF in 2023 on GDMT, HTN, uterine fibroids who was seen 02/21/2023 for the evaluation of CHF at the request of Dr. Jacqulyn Bath.   Assessment & Plan    HFrEF BiV HF Severe TR Cath without CAD c/w NICM.  MRI confirms NICM.   Heart Failure Therapy ACE-I/ARB/ARNI: entresto 97-103 mg BID BB: toprol xl 12.5 mg daily   MRA: spiro 25 mg daily SGLT2I: farxiga 10 mg daily Diuretic plan: can likely resume lasix 40 mg po daily today.   Likely stable for hospital d/c from CV standpoint.   HTN BP overall normal to hypotensive, likely no room for bidil.   Ascites s/p paracentesis with 3.4L removed 3 weeks prior; unclear etiology; has diuresed well.   Uterine fibroids -- follows with OBGYN -- per primary  NSVT: Monitor K, Mg; cont Toprol  For questions or updates, please contact  [facility] Please consult [email or web address] for contact info under        Signed, Parke Poisson, MD  02/26/2023, 8:50 AM

## 2023-03-07 ENCOUNTER — Telehealth (HOSPITAL_COMMUNITY): Payer: Self-pay

## 2023-03-07 NOTE — Telephone Encounter (Addendum)
Called to confirm/remind patient of their appointment at the [facility] on 03/08/23. However, patient's mailbox was full and was unable to leave a voice message.

## 2023-03-08 ENCOUNTER — Encounter (HOSPITAL_COMMUNITY): Payer: BC Managed Care – PPO

## 2024-01-16 NOTE — Progress Notes (Deleted)
 Cardiology Office Note    Patient Name: Mary Rios Date of Encounter: 01/16/2024  Primary Care Provider:  Diamantina Providence, FNP Primary Cardiologist:  Meriam Sprague, MD (Inactive) Primary Electrophysiologist: None   Past Medical History    Past Medical History:  Diagnosis Date   Edema of both lower extremities    Fibroids    Hypertension    Hypertensive urgency     History of Present Illness  Mary Rios is a 48 y.o. female with PMH of hypertension, NICM, decompensated HFrEF who presents today for follow-up.  [name] was last seen on 09/2022 for follow-up of CHF.  She was previously followed by Dr. Shari Prows and was recommended to complete a left and right heart cath for further evaluation.  She was also being followed by Dr. Gasper Lloyd in the advanced heart failure clinic.  She was admitted to the ED on 02/20/2024 with abdominal swelling.  She has a history of fibroids and thought they were swelling however this was secondary to fluid accumulation.  She was seen by Dr. Lynnette Caffey and chest x-ray showed severe enlargement of cardiac silhouette with bibasilar atelectasis and small pleural effusion.  She underwent a paracentesis and was treated with Lasix.  She also underwent a left and right heart cath that showed normal coronary arteries with elevated RA pressure and mildly elevated pulmonary pressure with normal filling pressures.  He had brisk diuresis of 10 L.  MRI was completed that confirmed nonischemic cardiomyopathy and patient was started on Entresto 97/103 mg twice daily, Toprol-XL, spironolactone, Farxiga.  She was advised to follow-up with advanced heart failure clinic following discharge but was a no-show.  During today's visit the patient reports*** .  Patient denies chest pain, palpitations, dyspnea, PND, orthopnea, nausea, vomiting, dizziness, syncope, edema, weight gain, or early satiety.  ***Notes: Patient will need to establish with Dr. Lynnette Caffey -Last  ischemic evaluation: -Last echo: -Interim ED visits: Review of Systems  Please see the history of present illness.    All other systems reviewed and are otherwise negative except as noted above.  Physical Exam    Wt Readings from Last 3 Encounters:  02/26/23 102 lb 12.8 oz (46.6 kg)  09/22/22 134 lb (60.8 kg)  09/21/22 133 lb (60.3 kg)   XB:JYNWG were no vitals filed for this visit.,There is no height or weight on file to calculate BMI. GEN: Well nourished, well developed in no acute distress Neck: No JVD; No carotid bruits Pulmonary: Clear to auscultation without rales, wheezing or rhonchi  Cardiovascular: Normal rate. Regular rhythm. Normal S1. Normal S2.   Murmurs: There is no murmur.  ABDOMEN: Soft, non-tender, non-distended EXTREMITIES:  No edema; No deformity   EKG/LABS/ Recent Cardiac Studies   ECG personally reviewed by me today - ***  Risk Assessment/Calculations:   {Does this patient have ATRIAL FIBRILLATION?:[id number]}      Lab Results  Component Value Date   WBC 4.2 02/22/2023   HGB 13.3 02/22/2023   HCT 39.0 02/22/2023   MCV 67.0 (L) 02/22/2023   PLT 300 02/22/2023   Lab Results  Component Value Date   CREATININE 1.28 (H) 02/26/2023   BUN 24 (H) 02/26/2023   NA 136 02/26/2023   K 3.7 02/26/2023   CL 104 02/26/2023   CO2 24 02/26/2023   No results found for: "CHOL", "HDL", "LDLCALC", "LDLDIRECT", "TRIG", "CHOLHDL"  No results found for: "HGBA1C" Assessment & Plan    1.  HFrEF:  2.  NICM:  3.  Essential  hypertension:  4.***      Disposition: Follow-up with Meriam Sprague, MD (Inactive) or APP in *** months {Are you ordering a CV Procedure (e.g. stress test, cath, DCCV, TEE, etc)?   Press F2        :[id number]}   Signed, Napoleon Form, Leodis Rains, NP 01/16/2024, 12:15 PM [facility]

## 2024-01-17 ENCOUNTER — Ambulatory Visit: Payer: BC Managed Care – PPO | Admitting: Nurse Practitioner

## 2024-01-17 DIAGNOSIS — I428 Other cardiomyopathies: Secondary | ICD-10-CM

## 2024-01-17 DIAGNOSIS — I5022 Chronic systolic (congestive) heart failure: Secondary | ICD-10-CM

## 2024-01-17 DIAGNOSIS — I110 Hypertensive heart disease with heart failure: Secondary | ICD-10-CM

## 2024-02-27 ENCOUNTER — Encounter (HOSPITAL_COMMUNITY): Payer: Self-pay | Admitting: Cardiology

## 2024-03-06 NOTE — Progress Notes (Signed)
 Cardiology Office Note    Patient Name: Mary Rios Date of Encounter: 03/07/2024  Primary Care Provider:  Bari Boos, FNP Primary Cardiologist:  None Primary Electrophysiologist: None   Past Medical History    Past Medical History:  Diagnosis Date   Edema of both lower extremities    Fibroids    Hypertension    Hypertensive urgency     History of Present Illness  Mary Rios is a 48 y.o. female with PMH of hypertension, NICM, decompensated HFrEF who presents today for follow-up.   Mary Rios was last seen on 09/2022 for follow-up of CHF.  She was previously followed by [name] and was recommended to complete a left and right heart cath for further evaluation.  She was also being followed by [name] in the advanced heart failure clinic.  She was admitted to the ED on 02/20/2023 with abdominal swelling.  She has a history of fibroids and thought they were swelling however this was secondary to fluid accumulation.  She was seen by [name] and chest x-ray showed severe enlargement of cardiac silhouette with bibasilar atelectasis and small pleural effusion.  She underwent a paracentesis and was treated with Lasix .  She also underwent a left and right heart cath that showed normal coronary arteries with elevated RA pressure and mildly elevated pulmonary pressure with normal filling pressures.  He had brisk diuresis of 10 L.  MRI was completed that confirmed nonischemic cardiomyopathy and patient was started on Entresto  97/103 mg twice daily, Toprol -XL, spironolactone , Farxiga .  She was advised to follow-up with advanced heart failure clinic following discharge but was a no-show.  Ms. [name] presents today for overdue follow-up of congestive heart failure.  She reports since her previous follow-up experiencing significantly elevated blood pressure, which she attributes to being off her medications for about two months since her last refill on August 31, 2023. During  this period, she has experienced intermittent swelling and has been monitoring her blood pressure at home, noting it remains elevated even when measured in the morning. She mentions a recent visit to her OBGYN, who suggested surgery for fibroids that have grown and may be pressing against her uterus. She experiences occasional pain related to the fibroids. She was started on Entresto , Farxiga , and spironolactone  at her last hospitalization in February 2024 but did not follow up with the heart failure clinic due to a job transfer and subsequent store closure. 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 No waking up gasping for air or palpitations. She notes swelling primarily in her legs, which improves with medication. She has gained approximately 28 pounds since her last visit in April 2024, when she was on her medications. Her current medications include Entresto , Farxiga , spironolactone , and Lasix , which she has not been taking consistently due to insurance and job-related issues. She monitors her weight daily and finds managing her salt intake challenging due to her work environment. Patient denies chest pain, palpitations, dyspnea, orthopnea, nausea, vomiting, dizziness, syncope, edema, weight gain, or early satiety.  Discussed the use of AI scribe software for clinical note transcription with the patient, who gave verbal consent to proceed.  History of Present Illness   Review of Systems  Please see the history of present illness.    All other systems reviewed and are otherwise negative except as noted above.  Physical Exam    Wt Readings from Last 3 Encounters:  03/07/24 130 lb 3.2  oz (59.1 kg)  02/26/23 102 lb 12.8 oz (46.6 kg)  09/22/22 134 lb (60.8 kg)   VS: Vitals:   03/07/24 0750 03/07/24 0845  BP: (!) 192/90 (!) 192/84  Pulse: 67   SpO2: 100%   ,Body mass index is 26.3 kg/m. GEN: Well nourished, well developed in no  acute distress Neck: No JVD; No carotid bruits Pulmonary: Clear to auscultation without rales, wheezing or rhonchi  Cardiovascular: Normal rate. Regular rhythm. Normal S1. Normal S2.   Murmurs: There is no murmur.  ABDOMEN: Soft, non-tender, non-distended EXTREMITIES:  No edema; No deformity   EKG/LABS/ Recent Cardiac Studies   ECG personally reviewed by me today -sinus rhythm with rate of 67 bpm and TWI in leads III and aVF consistent with previous EKG and possible LVH.  Risk Assessment/Calculations:          Lab Results  Component Value Date   WBC 4.2 02/22/2023   HGB 13.3 02/22/2023   HCT 39.0 02/22/2023   MCV 67.0 (L) 02/22/2023   PLT 300 02/22/2023   Lab Results  Component Value Date   CREATININE 1.28 (H) 02/26/2023   BUN 24 (H) 02/26/2023   NA 136 02/26/2023   K 3.7 02/26/2023   CL 104 02/26/2023   CO2 24 02/26/2023   No results found for: "CHOL", "HDL", "LDLCALC", "LDLDIRECT", "TRIG", "CHOLHDL"  No results found for: "HGBA1C" Assessment & Plan    1.  HFrEF: - MRI completed 02/2024 with ejection fraction of 20-25% and moderate RV dysfunction previous LHC with normal coronaries. -She was unfortunately off medications for two months, worsening symptoms.  - Restarted Entresto  97/103 mg twice daily, Lasix  40 mg twice daily, Farxiga  10 mg daily, spironolactone  25 mg daily, we will switch to Coreg  3.125 mg twice daily and discontinue Toprol -XL - Refer back to heart failure clinic. - Instructed to weigh daily and monitor for fluid retention. - Advised use of compression socks for leg swelling. - Scheduled follow-up in one month for medication efficacy and preoperative clearance.   2.  NICM: -Patient's previous EF of 20-25% by MRI in 02/2023 - Potential need for ICD if heart function does not improve. -We will recheck 2D echo with plans to check again in 3 months - Restarted Entresto , Lasix , Farxiga , spironolactone , carvedilol . - Referred to heart failure clinic.   3.   Essential hypertension: -HYPERTENSION CONTROL Vitals:   03/07/24 0750 03/07/24 0845  BP: (!) 192/90 (!) 192/84    The patient's blood pressure is elevated above target today.  In order to address the patient's elevated BP: Blood pressure will be monitored at home to determine if medication changes need to be made.; Follow up with general cardiology has been recommended.; A new medication was prescribed today.   -  Significantly elevated blood pressure due to non-adherence to medication. Increased risk of stroke and kidney damage. Blood pressure control crucial for surgical clearance for fibroid treatment. - Patient will resume BP medications as noted above with BMET in 2 weeks. -Patient will monitor results over next 2 weeks and submit findings to clinic.  4. Uterine fibroids: -Uterine fibroids increased in size, potentially contributing to elevated blood pressure. Surgical clearance required, blood pressure must be controlled before surgery. - Coordinated with OB-GYN for surgical clearance once blood pressure is controlled. - Discussed surgical options, including hysterectomy and embolization. - Ensured blood pressure control before surgical intervention.  Disposition: Follow-up with None or APP in  1 months    Signed, Francene Ing, Retha Cast, NP  03/07/2024, 8:46 AM [facility]

## 2024-03-07 ENCOUNTER — Encounter: Payer: Self-pay | Admitting: Nurse Practitioner

## 2024-03-07 ENCOUNTER — Ambulatory Visit: Attending: Nurse Practitioner | Admitting: Nurse Practitioner

## 2024-03-07 ENCOUNTER — Other Ambulatory Visit (HOSPITAL_COMMUNITY): Payer: Self-pay

## 2024-03-07 VITALS — BP 192/86 | HR 67 | Ht 59.0 in | Wt 130.2 lb

## 2024-03-07 DIAGNOSIS — I5022 Chronic systolic (congestive) heart failure: Secondary | ICD-10-CM

## 2024-03-07 DIAGNOSIS — I110 Hypertensive heart disease with heart failure: Secondary | ICD-10-CM | POA: Diagnosis not present

## 2024-03-07 DIAGNOSIS — I509 Heart failure, unspecified: Secondary | ICD-10-CM | POA: Diagnosis not present

## 2024-03-07 DIAGNOSIS — Z79899 Other long term (current) drug therapy: Secondary | ICD-10-CM

## 2024-03-07 DIAGNOSIS — D219 Benign neoplasm of connective and other soft tissue, unspecified: Secondary | ICD-10-CM

## 2024-03-07 LAB — BASIC METABOLIC PANEL WITH GFR
BUN/Creatinine Ratio: 10 (ref 9–23)
BUN: 7 mg/dL (ref 6–24)
CO2: 22 mmol/L (ref 20–29)
Calcium: 9 mg/dL (ref 8.7–10.2)
Chloride: 102 mmol/L (ref 96–106)
Creatinine, Ser: 0.67 mg/dL (ref 0.57–1.00)
Glucose: 91 mg/dL (ref 70–99)
Potassium: 3.6 mmol/L (ref 3.5–5.2)
Sodium: 138 mmol/L (ref 134–144)
eGFR: 108 mL/min/{1.73_m2} (ref >59)

## 2024-03-07 LAB — PRO B NATRIURETIC PEPTIDE: NT-Pro BNP: 140 pg/mL (ref 0–249)

## 2024-03-07 MED ORDER — FUROSEMIDE 40 MG PO TABS
40.0000 mg | ORAL_TABLET | Freq: Two times a day (BID) | ORAL | 3 refills | Status: AC
Start: 2024-03-07 — End: ?
  Filled 2024-03-07: qty 180, 90d supply, fill #0

## 2024-03-07 MED ORDER — ENTRESTO 97-103 MG PO TABS
1.0000 | ORAL_TABLET | Freq: Two times a day (BID) | ORAL | 3 refills | Status: AC
  Filled 2024-03-07: qty 180, 90d supply, fill #0

## 2024-03-07 MED ORDER — DAPAGLIFLOZIN PROPANEDIOL 10 MG PO TABS
10.0000 mg | ORAL_TABLET | Freq: Every day | ORAL | 3 refills | Status: AC
  Filled 2024-03-07: qty 90, 90d supply, fill #0

## 2024-03-07 MED ORDER — CARVEDILOL 3.125 MG PO TABS
3.1250 mg | ORAL_TABLET | Freq: Two times a day (BID) | ORAL | 3 refills | Status: AC
  Filled 2024-03-07: qty 180, 90d supply, fill #0

## 2024-03-07 MED ORDER — SPIRONOLACTONE 25 MG PO TABS
25.0000 mg | ORAL_TABLET | Freq: Every day | ORAL | 3 refills | Status: AC
Start: 2024-03-07 — End: ?
  Filled 2024-03-07: qty 90, 90d supply, fill #0

## 2024-03-07 MED ORDER — POTASSIUM CHLORIDE CRYS ER 20 MEQ PO TBCR
20.0000 meq | EXTENDED_RELEASE_TABLET | Freq: Every day | ORAL | 3 refills | Status: AC
Start: 2024-03-07 — End: ?
  Filled 2024-03-07: qty 90, 90d supply, fill #0

## 2024-03-07 NOTE — Patient Instructions (Addendum)
 Medication Instructions:  STOP Metoprolol   START Coreg  (Carvedilol ) 3.125mg  Take 1 tablet twice a day *If you need a refill on your cardiac medications before your next appointment, please call your pharmacy*  Lab Work: TODAY-BMET & BNP 2 WEEKS-BMET (03/21/2024) If you have labs (blood work) drawn today and your tests are completely normal, you will receive your results only by: MyChart Message (if you have MyChart) OR A paper copy in the mail If you have any lab test that is abnormal or we need to change your treatment, we will call you to review the results.  Testing/Procedures: Your physician has requested that you have an echocardiogram. Echocardiography is a painless test that uses sound waves to create images of your heart. It provides your doctor with information about the size and shape of your heart and how well your heart's chambers and valves are working. This procedure takes approximately one hour. There are no restrictions for this procedure. Please do NOT wear cologne, perfume, aftershave, or lotions (deodorant is allowed). Please arrive 15 minutes prior to your appointment time.  Please note: We ask at that you not bring children with you during ultrasound (echo/ vascular) testing. Due to room size and safety concerns, children are not allowed in the ultrasound rooms during exams. Our front office staff cannot provide observation of children in our lobby area while testing is being conducted. An adult accompanying a patient to their appointment will only be allowed in the ultrasound room at the discretion of the ultrasound technician under special circumstances. We apologize for any inconvenience.   Follow-Up: At [facility], you and your health needs are our priority.  As part of our continuing mission to provide you with exceptional heart care, our providers are all part of one team.  This team includes your primary Cardiologist (physician) and Advanced Practice  Providers or APPs (Physician Assistants and Nurse Practitioners) who all work together to provide you with the care you need, when you need it.  Your next appointment:   1 month(s)  Provider:   Charles Connor, NP      Then, Alyssa Backbone, MD will plan to see you again in 6 month(s).    We recommend signing up for the patient portal called "MyChart".  Sign up information is provided on this After Visit Summary.  MyChart is used to connect with patients for Virtual Visits (Telemedicine).  Patients are able to view lab/test results, encounter notes, upcoming appointments, etc.  Non-urgent messages can be sent to your provider as well.   To learn more about what you can do with MyChart, go to [email or web address].   Other Instructions OUR FAX NUMBER IS [phone] You have been referred to HEART FAILURE CLINIC  Please check your weight daily. Please contact the office if you gain more than 2lbs in a day or 5lbs in a week.  Limit your salt intake to 1500-2000mg  per day or 500mg  of Sodium per meal.  Check your blood pressure daily for 2 weeks, then contact the office with your readings.  Contact the office either by phone or MyChart with your readings.  Make sure to check your blood pressure 2 hours after taking your medications.   AVOID these things for 30 minutes before checking your blood pressure: No Drinking caffeine. No Drinking alcohol. No Eating. No Smoking. No Exercising.  Five minutes before checking your blood pressure: Pee. Sit in a dining chair. Avoid sitting in a soft couch or armchair. Be quiet. Do not talk.

## 2024-03-07 NOTE — Addendum Note (Signed)
 Addended by: Jamal Mays on: 03/07/2024 05:04 PM   Modules accepted: Orders

## 2024-03-11 ENCOUNTER — Other Ambulatory Visit: Payer: Self-pay

## 2024-03-12 ENCOUNTER — Other Ambulatory Visit: Payer: Self-pay

## 2024-03-12 DIAGNOSIS — I110 Hypertensive heart disease with heart failure: Secondary | ICD-10-CM

## 2024-03-12 DIAGNOSIS — I5022 Chronic systolic (congestive) heart failure: Secondary | ICD-10-CM

## 2024-03-12 DIAGNOSIS — Z79899 Other long term (current) drug therapy: Secondary | ICD-10-CM

## 2024-03-12 DIAGNOSIS — I509 Heart failure, unspecified: Secondary | ICD-10-CM

## 2024-03-18 ENCOUNTER — Other Ambulatory Visit (HOSPITAL_COMMUNITY): Payer: Self-pay

## 2024-04-09 ENCOUNTER — Ambulatory Visit: Payer: Self-pay | Admitting: Nurse Practitioner

## 2024-04-09 ENCOUNTER — Ambulatory Visit (HOSPITAL_COMMUNITY)
Admission: RE | Admit: 2024-04-09 | Discharge: 2024-04-09 | Disposition: A | Discharge status: Home / Self Care | Source: Ambulatory Visit | Attending: Nurse Practitioner | Admitting: Nurse Practitioner

## 2024-04-09 DIAGNOSIS — I509 Heart failure, unspecified: Secondary | ICD-10-CM | POA: Diagnosis not present

## 2024-04-09 DIAGNOSIS — Z79899 Other long term (current) drug therapy: Secondary | ICD-10-CM

## 2024-04-09 DIAGNOSIS — I110 Hypertensive heart disease with heart failure: Secondary | ICD-10-CM | POA: Diagnosis not present

## 2024-04-09 DIAGNOSIS — I5022 Chronic systolic (congestive) heart failure: Secondary | ICD-10-CM | POA: Diagnosis present

## 2024-04-09 LAB — ECHOCARDIOGRAM COMPLETE
AR max vel: 1.24 cm2
AV Area VTI: 1.34 cm2
AV Area mean vel: 1.36 cm2
AV Mean grad: 10.7 mmHg
AV Peak grad: 21.8 mmHg
Ao pk vel: 2.34 m/s
Area-P 1/2: 3.12 cm2
S' Lateral: 3.8 cm

## 2024-04-10 NOTE — Progress Notes (Unsigned)
 Cardiology Office Note    Patient Name: Mary Rios Date of Encounter: 04/11/2024  Primary Care Provider:  Bari Boos, FNP Primary Cardiologist:  Kyra Phy, MD Primary Electrophysiologist: None   Past Medical History    Past Medical History:  Diagnosis Date   Edema of both lower extremities    Fibroids    Hypertension    Hypertensive urgency     History of Present Illness  Mary Rios is a 48 y.o. female with PMH of hypertension, NICM, decompensated HFrEF who presents today for 1 month follow-up.  Mary Rios was last seen on 03/07/2024 for overdue follow-up.  During visit she reported being off of her medications for about 2 months and had noted intermittent swelling and elevated BP.  She was unable to take medications due to a new job and insurance related issues.  During today's visit her blood pressure was elevated at 192/90 and was 192/84 on recheck.  She was referred back to CHF clinic and restarted on Entresto , Lasix , Farxiga , and spironolactone .  She had carvedilol  added with Toprol -XL discontinued due to elevated BP.  She underwent a recheck of 2D echo that showed improved EF of 50-55%.  Mary Rios presents today for 1 month follow-up of blood pressure and CHF.She experiences difficulty breathing overnight, which led her to self-medicate with Inadrile, a decongestant. Her blood pressure was recorded at 152/76 today, with a previous reading of 200/87 that improved upon rechecking. She monitors her blood pressure primarily in the morning. She is currently taking carvedilol  and Entresto , both dosed twice daily. She also takes Lasix  40 mg twice daily, with an additional 40 mg if she notices weight gain or swelling. Her weight has decreased by 4 pounds since the last visit, from 130 to 126 pounds. She has experienced improvement in ankle swelling and reports that her weight is stable. She has not experienced any significant side effects from her medications, though she  noted feeling unwell if she does not eat enough when taking them. She has a history of heart failure with a previous echocardiogram showing an ejection fraction of 20-25%, which has improved to 50-55% with current treatment. She inquired about safe cold medications due to congestion. She has not been around anyone sick and has not experienced symptoms like chills, fever, or body aches. Patient denies chest pain, palpitations, dyspnea, PND, orthopnea, nausea, vomiting, dizziness, syncope, edema, weight gain, or early satiety.  Discussed the use of AI scribe software for clinical note transcription with the patient, who gave verbal consent to proceed.  History of Present Illness   Review of Systems  Please see the history of present illness.    All other systems reviewed and are otherwise negative except as noted above.  Physical Exam     Wt Readings from Last 3 Encounters:  04/11/24 126 lb (57.2 kg)  03/07/24 130 lb 3.2 oz (59.1 kg)  02/26/23 102 lb 12.8 oz (46.6 kg)   VS: Vitals:   04/11/24 0954 04/11/24 1020  BP: (!) 152/76 (!) 152/82  Pulse: 74   SpO2: 99%   ,Body mass index is 25.45 kg/m. GEN: Well nourished, well developed in no acute distress Neck: No JVD; No carotid bruits Pulmonary: Clear to auscultation without rales, wheezing or rhonchi  Cardiovascular: Normal rate. Regular rhythm. Normal S1. Normal S2.   Murmurs: There is no murmur.  ABDOMEN: Soft, non-tender, non-distended EXTREMITIES:  No edema; No deformity   EKG/LABS/ Recent Cardiac Studies   ECG personally reviewed  by me today -none completed today  Risk Assessment/Calculations:          Lab Results  Component Value Date   WBC 4.2 02/22/2023   HGB 13.3 02/22/2023   HCT 39.0 02/22/2023   MCV 67.0 (L) 02/22/2023   PLT 300 02/22/2023   Lab Results  Component Value Date   CREATININE 0.67 03/07/2024   BUN 7 03/07/2024   NA 138 03/07/2024   K 3.6 03/07/2024   CL 102 03/07/2024   CO2 22 03/07/2024    No results found for: "CHOL", "HDL", "LDLCALC", "LDLDIRECT", "TRIG", "CHOLHDL"  No results found for: "HGBA1C" Assessment & Plan    Assessment & Plan  1.HFimEF/NICM -Heart failure well-managed with improved cardiac function. Ejection fraction improved to 50-55%. Left ventricular hypertrophy with diastolic dysfunction persists. Entresto  aiding muscle relaxation. No significant valve issues. - Continue Entresto  and carvedilol . - Follow up with heart failure clinic. - Monitor for fluid retention; adjust Lasix  as needed.  2.  Essential hypertension: -HYPERTENSION CONTROL Vitals:   04/11/24 0954 04/11/24 1020  BP: (!) 152/76 (!) 152/82    The patient's blood pressure is elevated above target today.  In order to address the patient's elevated BP: Blood pressure will be monitored at home to determine if medication changes need to be made.     -Blood pressure elevated at 152/76, likely due to decongestant use. Home monitoring shows improvement. Current regimen with carvedilol  and Entresto  is effective and well-tolerated. - Continue carvedilol  and Entresto . - Monitor blood pressure at home, including evening. - Avoid decongestants; use Corticetan HBP and Mucinex  (blue box). - Consider increasing carvedilol  to 6.25 mg if needed; she will call for adjustment.  3.  History of fibroids: -Uterine fibroids increased in size, potentially contributing to elevated blood pressure. Surgical clearance required, blood pressure must be controlled before surgery. - Coordinated with OB-GYN for surgical clearance once blood pressure is controlled. - Discussed surgical options, including hysterectomy and embolization. - Ensured blood pressure control before surgical intervention.   Disposition: Follow-up with Arun K Thukkani, MD or APP in 6 months    Signed, Francene Ing, Retha Cast, NP 04/11/2024, 12:20 PM [facility]

## 2024-04-11 ENCOUNTER — Encounter: Payer: Self-pay | Admitting: Nurse Practitioner

## 2024-04-11 ENCOUNTER — Ambulatory Visit: Attending: Nurse Practitioner | Admitting: Nurse Practitioner

## 2024-04-11 VITALS — BP 152/82 | HR 74 | Ht 59.0 in | Wt 126.0 lb

## 2024-04-11 DIAGNOSIS — I428 Other cardiomyopathies: Secondary | ICD-10-CM | POA: Diagnosis not present

## 2024-04-11 DIAGNOSIS — D259 Leiomyoma of uterus, unspecified: Secondary | ICD-10-CM | POA: Diagnosis not present

## 2024-04-11 DIAGNOSIS — I5040 Unspecified combined systolic (congestive) and diastolic (congestive) heart failure: Secondary | ICD-10-CM | POA: Diagnosis not present

## 2024-04-11 DIAGNOSIS — I110 Hypertensive heart disease with heart failure: Secondary | ICD-10-CM | POA: Diagnosis not present

## 2024-04-11 NOTE — Patient Instructions (Addendum)
 Medication Instructions:  Do not take any decongestive's You can take plain mucinex  *If you need a refill on your cardiac medications before your next appointment, please call your pharmacy*  Lab Work: No labs  Testing/Procedures: No testing  Follow-Up: At [facility], you and your health needs are our priority.  As part of our continuing mission to provide you with exceptional heart care, our providers are all part of one team.  This team includes your primary Cardiologist (physician) and Advanced Practice Providers or APPs (Physician Assistants and Nurse Practitioners) who all work together to provide you with the care you need, when you need it.  Your next appointment:   6 month(s)  Provider:   Arun K Thukkani, MD or Charles Connor, NP  We recommend signing up for the patient portal called "MyChart".  Sign up information is provided on this After Visit Summary.  MyChart is used to connect with patients for Virtual Visits (Telemedicine).  Patients are able to view lab/test results, encounter notes, upcoming appointments, etc.  Non-urgent messages can be sent to your provider as well.   To learn more about what you can do with MyChart, go to [email or web address].   Other Instructions CHF clinic phone number Is  [phone] Please take your blood pressure daily for 1 week and send in a MyChart message. Please include heart rates. (One message at the end of the week).   HOW TO TAKE YOUR BLOOD PRESSURE: Rest 5 minutes before taking your blood pressure. Don't smoke or drink caffeinated beverages for at least 30 minutes before. Take your blood pressure before (not after) you eat. Sit comfortably with your back supported and both feet on the floor (don't cross your legs). Elevate your arm to heart level on a table or a desk. Use the proper sized cuff. It should fit smoothly and snugly around your bare upper arm. There should be enough room to slip a fingertip under the cuff.  The bottom edge of the cuff should be 1 inch above the crease of the elbow. Ideally, take 3 measurements at one sitting and record the average.

## 2024-06-04 ENCOUNTER — Other Ambulatory Visit: Payer: Self-pay | Admitting: Interventional Radiology

## 2024-06-04 DIAGNOSIS — D259 Leiomyoma of uterus, unspecified: Secondary | ICD-10-CM

## 2024-06-18 ENCOUNTER — Other Ambulatory Visit: Payer: Self-pay | Admitting: Obstetrics and Gynecology

## 2024-06-18 DIAGNOSIS — D259 Leiomyoma of uterus, unspecified: Secondary | ICD-10-CM

## 2024-08-08 ENCOUNTER — Telehealth (HOSPITAL_COMMUNITY): Payer: Self-pay | Admitting: Cardiology

## 2024-08-08 NOTE — Telephone Encounter (Signed)
 Called to confirm/remind patient of their appointment at the Advanced Heart Failure Clinic on 08/08/2024.   Appointment:   [] Confirmed  [x] Left mess   [] No answer/No voice mail  [] VM Full/unable to leave message  [] Phone not in service  Patient reminded to bring all medications and/or complete list.  Confirmed patient has transportation. Gave directions, instructed to utilize valet parking.

## 2024-08-09 ENCOUNTER — Encounter (HOSPITAL_COMMUNITY): Admitting: Cardiology
